# Patient Record
Sex: Male | Born: 1955 | Race: Black or African American | Hispanic: No | Marital: Married | Smoking: Never smoker
Health system: Southern US, Community
[De-identification: ages and names within clinical notes are randomized; demographics above are authoritative.]

## PROBLEM LIST (undated history)

## (undated) DIAGNOSIS — R51 Headache: Secondary | ICD-10-CM

## (undated) DIAGNOSIS — M778 Other enthesopathies, not elsewhere classified: Secondary | ICD-10-CM

## (undated) DIAGNOSIS — G4733 Obstructive sleep apnea (adult) (pediatric): Secondary | ICD-10-CM

## (undated) DIAGNOSIS — I10 Essential (primary) hypertension: Secondary | ICD-10-CM

## (undated) DIAGNOSIS — M7550 Bursitis of unspecified shoulder: Secondary | ICD-10-CM

## (undated) DIAGNOSIS — R519 Headache, unspecified: Secondary | ICD-10-CM

## (undated) DIAGNOSIS — M25519 Pain in unspecified shoulder: Secondary | ICD-10-CM

## (undated) DIAGNOSIS — E291 Testicular hypofunction: Secondary | ICD-10-CM

## (undated) DIAGNOSIS — G459 Transient cerebral ischemic attack, unspecified: Secondary | ICD-10-CM

## (undated) DIAGNOSIS — G4730 Sleep apnea, unspecified: Secondary | ICD-10-CM

## (undated) DIAGNOSIS — Z9989 Dependence on other enabling machines and devices: Secondary | ICD-10-CM

## (undated) DIAGNOSIS — M7580 Other shoulder lesions, unspecified shoulder: Secondary | ICD-10-CM

## (undated) DIAGNOSIS — M1990 Unspecified osteoarthritis, unspecified site: Secondary | ICD-10-CM

## (undated) HISTORY — PX: POLYPECTOMY: SHX149

## (undated) HISTORY — DX: Essential (primary) hypertension: I10

## (undated) HISTORY — PX: TONSILLECTOMY: SUR1361

## (undated) HISTORY — DX: Pain in unspecified shoulder: M25.519

## (undated) HISTORY — DX: Testicular hypofunction: E29.1

---
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
## 1976-05-31 HISTORY — PX: APPENDECTOMY: SHX54

## 2006-05-17 ENCOUNTER — Ambulatory Visit: Payer: Self-pay | Admitting: Internal Medicine

## 2006-06-17 ENCOUNTER — Ambulatory Visit: Payer: Self-pay | Admitting: Internal Medicine

## 2007-12-14 ENCOUNTER — Telehealth: Payer: Self-pay | Admitting: Internal Medicine

## 2008-01-02 ENCOUNTER — Telehealth: Payer: Self-pay | Admitting: Internal Medicine

## 2008-01-15 ENCOUNTER — Ambulatory Visit: Payer: Self-pay | Admitting: Internal Medicine

## 2008-01-15 DIAGNOSIS — I10 Essential (primary) hypertension: Secondary | ICD-10-CM

## 2008-01-15 DIAGNOSIS — M25519 Pain in unspecified shoulder: Secondary | ICD-10-CM

## 2008-01-15 HISTORY — DX: Pain in unspecified shoulder: M25.519

## 2008-01-15 HISTORY — DX: Essential (primary) hypertension: I10

## 2008-04-26 ENCOUNTER — Ambulatory Visit: Payer: Self-pay | Admitting: Internal Medicine

## 2008-04-26 LAB — CONVERTED CEMR LAB
ALT: 37 units/L (ref 0–53)
Albumin: 3.5 g/dL (ref 3.5–5.2)
BUN: 10 mg/dL (ref 6–23)
Basophils Relative: 0.3 % (ref 0.0–3.0)
Bilirubin Urine: NEGATIVE
Bilirubin, Direct: 0.1 mg/dL (ref 0.0–0.3)
CO2: 33 meq/L — ABNORMAL HIGH (ref 19–32)
Calcium: 8.9 mg/dL (ref 8.4–10.5)
Creatinine, Ser: 1.1 mg/dL (ref 0.4–1.5)
Direct LDL: 135.3 mg/dL
Glucose, Bld: 91 mg/dL (ref 70–99)
Glucose, Urine, Semiquant: NEGATIVE
HCT: 43.8 % (ref 39.0–52.0)
Hemoglobin: 15.3 g/dL (ref 13.0–17.0)
Lymphocytes Relative: 34.1 % (ref 12.0–46.0)
Monocytes Absolute: 0.6 10*3/uL (ref 0.1–1.0)
Monocytes Relative: 7.6 % (ref 3.0–12.0)
Neutro Abs: 4 10*3/uL (ref 1.4–7.7)
PSA: 0.41 ng/mL (ref 0.10–4.00)
Protein, U semiquant: NEGATIVE
RBC: 4.63 M/uL (ref 4.22–5.81)
Total CHOL/HDL Ratio: 5.4
Total Protein: 7 g/dL (ref 6.0–8.3)
Urobilinogen, UA: 0.2
VLDL: 13 mg/dL (ref 0–40)
WBC Urine, dipstick: NEGATIVE
pH: 6.5

## 2008-05-21 ENCOUNTER — Ambulatory Visit: Payer: Self-pay | Admitting: Internal Medicine

## 2008-05-21 DIAGNOSIS — E291 Testicular hypofunction: Secondary | ICD-10-CM | POA: Insufficient documentation

## 2008-05-21 HISTORY — DX: Testicular hypofunction: E29.1

## 2008-08-20 ENCOUNTER — Telehealth: Payer: Self-pay | Admitting: Internal Medicine

## 2008-11-28 HISTORY — PX: PLANTAR FASCIA SURGERY: SHX746

## 2008-12-20 ENCOUNTER — Encounter: Payer: Self-pay | Admitting: Internal Medicine

## 2008-12-26 ENCOUNTER — Ambulatory Visit (HOSPITAL_BASED_OUTPATIENT_CLINIC_OR_DEPARTMENT_OTHER): Admission: RE | Admit: 2008-12-26 | Discharge: 2008-12-26 | Payer: Self-pay | Admitting: Orthopedic Surgery

## 2009-12-17 ENCOUNTER — Ambulatory Visit: Payer: Self-pay | Admitting: Internal Medicine

## 2009-12-17 LAB — CONVERTED CEMR LAB
ALT: 33 units/L (ref 0–53)
AST: 33 units/L (ref 0–37)
Alkaline Phosphatase: 64 units/L (ref 39–117)
Basophils Relative: 0.4 % (ref 0.0–3.0)
Bilirubin Urine: NEGATIVE
Bilirubin, Direct: 0.2 mg/dL (ref 0.0–0.3)
Calcium: 8.8 mg/dL (ref 8.4–10.5)
Chloride: 106 meq/L (ref 96–112)
Cholesterol: 211 mg/dL — ABNORMAL HIGH (ref 0–200)
Creatinine, Ser: 1 mg/dL (ref 0.4–1.5)
Eosinophils Relative: 3 % (ref 0.0–5.0)
Lymphocytes Relative: 33.3 % (ref 12.0–46.0)
MCV: 95.7 fL (ref 78.0–100.0)
Neutrophils Relative %: 54.7 % (ref 43.0–77.0)
Nitrite: NEGATIVE
Protein, U semiquant: NEGATIVE
RBC: 4.36 M/uL (ref 4.22–5.81)
Total CHOL/HDL Ratio: 5
Total Protein: 6.6 g/dL (ref 6.0–8.3)
Urobilinogen, UA: 0.2
VLDL: 20 mg/dL (ref 0.0–40.0)
WBC: 9.4 10*3/uL (ref 4.5–10.5)

## 2010-01-01 ENCOUNTER — Ambulatory Visit: Payer: Self-pay | Admitting: Internal Medicine

## 2010-01-02 ENCOUNTER — Telehealth: Payer: Self-pay | Admitting: Internal Medicine

## 2010-01-05 ENCOUNTER — Encounter: Payer: Self-pay | Admitting: Internal Medicine

## 2010-02-05 ENCOUNTER — Encounter (INDEPENDENT_AMBULATORY_CARE_PROVIDER_SITE_OTHER): Payer: Self-pay | Admitting: *Deleted

## 2010-03-19 ENCOUNTER — Encounter (INDEPENDENT_AMBULATORY_CARE_PROVIDER_SITE_OTHER): Payer: Self-pay | Admitting: *Deleted

## 2010-06-30 NOTE — Letter (Signed)
Summary: Pre Visit No Show Letter  [facility]  [address] [place], [place] [address]   Phone: [phone]  Fax: [phone]        March 19, 2010 MRN: [id number]    Cesar [name] [address] [place], [place]  [address]    Dear [name],   We have been unable to reach you by phone concerning the pre-procedure visit that you missed on 03/19/10. For this reason,your procedure scheduled on 03/30/10 has been cancelled. Our scheduling staff will gladly assist you with rescheduling your appointments at a more convenient time. Please call our office at [phone] between the hours of 8:00am and 5:00pm, press option #2 to reach an appointment scheduler. Please consider updating your contact numbers at this time so that we can reach you by phone in the future with schedule changes or results.    Thank you,    Wyona Almas RN [facility]

## 2010-06-30 NOTE — Medication Information (Signed)
Summary: Androgel Gel Packet Approved  Androgel Gel Packet Approved   Imported By: Maryln Gottron 01/08/2010 11:18:15  _____________________________________________________________________  External Attachment:    Type:   Image     Comment:   External Document

## 2010-06-30 NOTE — Progress Notes (Signed)
Summary: androgel rx  Phone Note Outgoing Call   Call placed by: Duard Brady LPN,  January 02, 2010 2:30 PM Call placed to: [facility] Summary of Call: spoke with [facility] pharmacist - refill adjusted - disp #90 with 1 refill - controlled med - can only do 6mos . KIK Initial call taken by: Duard Brady LPN,  January 02, 2010 2:31 PM    Prescriptions: ANDROGEL 50 MG/5GM GEL (TESTOSTERONE) use every am  #90 x 1   Entered by:   Duard Brady LPN   Authorized by:   Gordy Savers  MD   Signed by:   Duard Brady LPN on 40/98/1191   Method used:   Historical   RxID:   [id number]

## 2010-06-30 NOTE — Letter (Signed)
Summary: Pre Visit Letter Revised  [facility]  [address] [place], [place] [address]   Phone: [phone]  Fax: [phone]        02/05/2010 MRN: [id number] Cesar [name] [address] [place], [place]  [address]             Procedure Date:  Oct 31 at 4pm   Welcome to the Gastroenterology Division at [facility].    You are scheduled to see a nurse for your pre-procedure visit on Mar 19, 2010 at 4:30pm on the 3rd floor at [facility], [address]  We ask that you try to arrive at our office 15 minutes prior to your appointment time to allow for check-in.  Please take a minute to review the attached form.  If you answer "Yes" to one or more of the questions on the first page, we ask that you call the person listed at your earliest opportunity.  If you answer "No" to all of the questions, please complete the rest of the form and bring it to your appointment.    Your nurse visit will consist of discussing your medical and surgical history, your immediate family medical history, and your medications.   If you are unable to list all of your medications on the form, please bring the medication bottles to your appointment and we will list them.  We will need to be aware of both prescribed and over the counter drugs.  We will need to know exact dosage information as well.    Please be prepared to read and sign documents such as consent forms, a financial agreement, and acknowledgement forms.  If necessary, and with your consent, a friend or relative is welcome to sit-in on the nurse visit with you.  Please bring your insurance card so that we may make a copy of it.  If your insurance requires a referral to see a specialist, please bring your referral form from your primary care physician.  No co-pay is required for this nurse visit.     If you cannot keep your appointment, please call [phone] to cancel or reschedule prior to your appointment date.  This  allows [place] the opportunity to schedule an appointment for another patient in need of care.    Thank you for choosing [facility] for your medical needs.  We appreciate the opportunity to care for you.  Please visit [place] at our website  to learn more about our practice.  Sincerely, The Gastroenterology Division

## 2010-06-30 NOTE — Assessment & Plan Note (Signed)
Summary: cpx//ccm   Vital Signs:  [name] profile:   55 year old male Height:      68.25 inches Weight:      215 pounds BMI:     32.57 Temp:     98.1 degrees F oral BP sitting:   100 / 64  (right arm) Cuff size:   regular  Vitals Entered By: Duard Brady LPN (January 01, 2010 1:53 PM) CC: cpx- doing well Is [name] Diabetic? No   CC:  cpx- doing well.  History of Present Illness: Cesar [name] who is seen today for a wellness exam.  He has a history of treated hypertension.  ED, and testosterone deficiency.  He has gained some weight of late and is in the process of exercise more regularly and has enjoyed a 6-pound weight loss.  No concerns or complaints  Preventive Screening-Counseling & Management  Alcohol-Tobacco     Smoking Status: never  Allergies (verified): No Known Drug Allergies  Past History:  Past Medical History: Reviewed history from 05/21/2008 and no changes required. Hypertension ED right shoulder pain testosterone deficiency  Past Surgical History: inguinal hernia repair colonoscopy -none left heel spur removal ( questionable fasciotomy) Dr. Eulah Pont  Family History: Reviewed history from 05/21/2008 and no changes required. father history of cervical vascular disease and repair.  Assess hypertension, dyslipidemia mother history of hypertension  One brother with testosterone deficiency  Social History: Reviewed history from 05/21/2008 and no changes required. Married Never Smoked Regular exercise-yes high school math teacher  Review of Systems       The [name] complains of weight gain.  The [name] denies anorexia, fever, weight loss, vision loss, decreased hearing, hoarseness, chest pain, syncope, dyspnea on exertion, peripheral edema, prolonged cough, headaches, hemoptysis, abdominal pain, melena, hematochezia, severe indigestion/heartburn, hematuria, incontinence, genital sores, muscle weakness, suspicious skin lesions,  transient blindness, difficulty walking, depression, unusual weight change, abnormal bleeding, enlarged lymph nodes, angioedema, breast masses, and testicular masses.    Physical Exam  General:  overweight-appearing.  110/70overweight-appearing.   Head:  Normocephalic and atraumatic without obvious abnormalities. No apparent alopecia or balding. Eyes:  No corneal or conjunctival inflammation noted. EOMI. Perrla. Funduscopic exam benign, without hemorrhages, exudates or papilledema. Vision grossly normal. Ears:  External ear exam shows no significant lesions or deformities.  Otoscopic examination reveals clear canals, tympanic membranes are intact bilaterally without bulging, retraction, inflammation or discharge. Hearing is grossly normal bilaterally. Nose:  External nasal examination shows no deformity or inflammation. Nasal mucosa are pink and moist without lesions or exudates. Mouth:  Oral mucosa and oropharynx without lesions or exudates.  Teeth in good repair. Neck:  No deformities, masses, or tenderness noted. Chest Wall:  No deformities, masses, tenderness or gynecomastia noted. Breasts:  No masses or gynecomastia noted Lungs:  Normal respiratory effort, chest expands symmetrically. Lungs are clear to auscultation, no crackles or wheezes. Heart:  Normal rate and regular rhythm. S1 and S2 normal without gallop, murmur, click, rub or other extra sounds. Abdomen:  Bowel sounds positive,abdomen soft and non-tender without masses, organomegaly or hernias noted. Rectal:  No external abnormalities noted. Normal sphincter tone. No rectal masses or tenderness. Genitalia:  Testes bilaterally descended without nodularity, tenderness or masses. No scrotal masses or lesions. No penis lesions or urethral discharge. Prostate:  Prostate gland firm and smooth, no enlargement, nodularity, tenderness, mass, asymmetry or induration. Msk:  No deformity or scoliosis noted of thoracic or lumbar spine.   Pulses:   R and L carotid,radial,femoral,dorsalis pedis and  posterior tibial pulses are full and equal bilaterally Neurologic:  alert & oriented X3, cranial nerves II-XII intact, strength normal in all extremities, gait normal, DTRs symmetrical and normal, and heel-to-shin normal.  alert & oriented X3, cranial nerves II-XII intact, strength normal in all extremities, gait normal, DTRs symmetrical and normal, and heel-to-shin normal.   Skin:  Intact without suspicious lesions or rashes Cervical Nodes:  No lymphadenopathy noted Axillary Nodes:  No palpable lymphadenopathy Inguinal Nodes:  No significant adenopathy Psych:  Cognition and judgment appear intact. Alert and cooperative with normal attention span and concentration. No apparent delusions, illusions, hallucinations   Impression & Recommendations:  Problem # 1:  Preventive Health Care (ICD-V70.0)  Orders: Gastroenterology Referral (GI)  Complete Medication List: 1)  Cialis 20 Mg Tabs (Tadalafil) .Marland Kitchen.. 1 as needed 2)  Benazepril-hydrochlorothiazide 20-12.5 Mg Tabs (Benazepril-hydrochlorothiazide) .... 2 once daily 3)  Naproxen Dr 500 Mg Tbec (Naproxen) .Marland Kitchen.. 1 two times a day 4)  Androgel 50 Mg/5gm Gel (Testosterone) .... Use every am  [name] Instructions: 1)  Limit your Sodium (Salt) to less than 2 grams a day(slightly less than 1/2 a teaspoon) to prevent fluid retention, swelling, or worsening of symptoms. 2)  It is important that you exercise regularly at least 20 minutes 5 times a week. If you develop chest pain, have severe difficulty breathing, or feel very tired , stop exercising immediately and seek medical attention. 3)  You need to lose weight. Consider a lower calorie diet and regular exercise.  4)  Check your Blood Pressure regularly. If it is above: 150/90 you should make an appointment. Prescriptions: ANDROGEL 50 MG/5GM GEL (TESTOSTERONE) use every am  #90 x 6   Entered and Authorized by:   Gordy Savers  MD   Signed by:    Gordy Savers  MD on 01/01/2010   Method used:   Print then Give to [name]   RxID:   [id number] BENAZEPRIL-HYDROCHLOROTHIAZIDE 20-12.5 MG  TABS (BENAZEPRIL-HYDROCHLOROTHIAZIDE) 2 once daily  #180 Tablet x 4   Entered and Authorized by:   Gordy Savers  MD   Signed by:   Gordy Savers  MD on 01/01/2010   Method used:   Electronically to        [facility]  [address] # [id number]* (retail)       [address]       [place], [place]  [address]       Ph: [phone] or [phone]       Fax: [phone]   RxID:   [id number] CIALIS 20 MG  TABS (TADALAFIL) 1 as needed  #6 x 4   Entered and Authorized by:   Gordy Savers  MD   Signed by:   Gordy Savers  MD on 01/01/2010   Method used:   Electronically to        [facility]  [address] # [id number]* (retail)       [address]       [place], [place]  [address]       Ph: [phone] or [phone]       Fax: [phone]   RxID:   [id number]

## 2010-07-14 ENCOUNTER — Ambulatory Visit (INDEPENDENT_AMBULATORY_CARE_PROVIDER_SITE_OTHER): Payer: BC Managed Care – PPO | Admitting: Internal Medicine

## 2010-07-14 ENCOUNTER — Encounter: Payer: Self-pay | Admitting: Internal Medicine

## 2010-07-14 DIAGNOSIS — E785 Hyperlipidemia, unspecified: Secondary | ICD-10-CM

## 2010-07-14 DIAGNOSIS — E291 Testicular hypofunction: Secondary | ICD-10-CM

## 2010-07-14 DIAGNOSIS — R42 Dizziness and giddiness: Secondary | ICD-10-CM

## 2010-07-14 DIAGNOSIS — I10 Essential (primary) hypertension: Secondary | ICD-10-CM

## 2010-07-14 MED ORDER — BENAZEPRIL HCL 20 MG PO TABS: 20.0000 mg | ORAL_TABLET | Freq: Every day | ORAL | Status: DC

## 2010-07-14 NOTE — Progress Notes (Signed)
  Subjective:    Patient ID: Cesar [name], male    DOB: 01-22-1956, 55 y.o.   MRN: [id number]  HPI  55 year old patient who has a history of hypertension, treated with combination therapy.  For the past 4 to 6 months.  He has had occasional episodes of significant lightheadedness.  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  He feels quite flushed ;  denies any diaphoresis, any focal neurological symptoms.  His father had a hemispheric stroke about his Age  that has resulted in a chronic right hemiparesis and aphasia.  He is concerned about carotid artery stenosis.  Laboratory studies from his last physical reviewed and did reveal mild dyslipidemia.     Review of Systems  Constitutional: Negative for fever, chills, appetite change and fatigue.  HENT: Negative for hearing loss, ear pain, congestion, sore throat, trouble swallowing, neck stiffness, dental problem, voice change and tinnitus.   Eyes: Negative for pain, discharge and visual disturbance.  Respiratory: Negative for cough, chest tightness, wheezing and stridor.   Cardiovascular: Negative for chest pain, palpitations and leg swelling.  Gastrointestinal: Negative for nausea, vomiting, abdominal pain, diarrhea, constipation, blood in stool and abdominal distention.  Genitourinary: Negative for urgency, hematuria, flank pain, discharge, difficulty urinating and genital sores.  Musculoskeletal: Negative for myalgias, back pain, joint swelling, arthralgias and gait problem.  Skin: Negative for rash.  Neurological: Positive for light-headedness. Negative for dizziness, syncope, speech difficulty, weakness, numbness and headaches.  Hematological: Negative for adenopathy. Does not bruise/bleed easily.  Psychiatric/Behavioral: Negative for behavioral problems and dysphoric mood. The patient is not nervous/anxious.        Objective:   Physical Exam  Constitutional: He is oriented to person, place, and  time. He appears well-developed and well-nourished.       Blood pressure 124/76 without orthostatic change.  No  symptoms were produced from standing from a supine position  HENT:  Head: Normocephalic and atraumatic.  Right Ear: External ear normal.  Left Ear: External ear normal.  Eyes: Conjunctivae and EOM are normal.  Neck: Normal range of motion.  Cardiovascular: Normal rate and normal heart sounds.   Pulmonary/Chest: Breath sounds normal.  Abdominal: Bowel sounds are normal.  Musculoskeletal: Normal range of motion. He exhibits no edema and no tenderness.  Neurological: He is alert and oriented to person, place, and time.  Psychiatric: He has a normal mood and affect. His behavior is normal.          Assessment & Plan:  Dizziness- concern about the possibility of hypotension;  Will change blood pressure medication to 20 mg of benazapril only; will schedule carotid artery.  Doppler study Hypertension Mild dyslipidemia-  Weiss modification discussed at length.  Will reassess lipid profile at the time of his next annual exam and consider statin therapy.  At that time

## 2010-07-14 NOTE — Patient Instructions (Signed)
Please check your blood pressure on a regular basis.  If it is consistently greater than 150/90, please make an office appointment.   It is important that you exercise regularly, at least 20 minutes 3 to 4 times per week.  If you develop chest pain or shortness of breath seek  medical attention.  Annual exam, in July/August

## 2010-07-15 ENCOUNTER — Encounter: Payer: Self-pay | Admitting: Internal Medicine

## 2010-07-15 DIAGNOSIS — R42 Dizziness and giddiness: Secondary | ICD-10-CM | POA: Insufficient documentation

## 2010-07-16 ENCOUNTER — Encounter (INDEPENDENT_AMBULATORY_CARE_PROVIDER_SITE_OTHER): Payer: BC Managed Care – PPO

## 2010-07-16 DIAGNOSIS — R42 Dizziness and giddiness: Secondary | ICD-10-CM

## 2010-07-20 ENCOUNTER — Telehealth: Payer: Self-pay | Admitting: Internal Medicine

## 2010-07-20 DIAGNOSIS — I10 Essential (primary) hypertension: Secondary | ICD-10-CM

## 2010-07-20 MED ORDER — BENAZEPRIL HCL 20 MG PO TABS: 20.0000 mg | ORAL_TABLET | Freq: Every day | ORAL | Status: DC

## 2010-07-20 NOTE — Telephone Encounter (Signed)
Pt was given a new rx for bp med, pt lost rx please call [facility] [address] [phone]

## 2010-07-20 NOTE — Telephone Encounter (Signed)
Called to [facility]. KIK

## 2010-07-20 NOTE — Telephone Encounter (Signed)
Message opened in error

## 2010-07-22 NOTE — Miscellaneous (Signed)
Summary: Orders Update  Clinical Lists Changes  Problems: Added new problem of DIZZINESS (ICD-780.4) Orders: Added new Test order of Carotid Duplex (Carotid Duplex) - Signed

## 2010-09-06 LAB — POCT I-STAT, CHEM 8
Hemoglobin: 15.6 g/dL (ref 13.0–17.0)
Potassium: 3.9 mEq/L (ref 3.5–5.1)
Sodium: 138 mEq/L (ref 135–145)
TCO2: 28 mmol/L (ref 0–100)

## 2010-09-06 LAB — POCT HEMOGLOBIN-HEMACUE: Hemoglobin: 15.5 g/dL (ref 13.0–17.0)

## 2010-10-13 NOTE — Op Note (Signed)
NAME:  Cesar [name], Cesar [name] NO.:  [id number]   MEDICAL RECORD NO.:  [id number]          PATIENT TYPE:  AMB   LOCATION:  DSC                          FACILITY:  [facility]   PHYSICIAN:  Loreta Ave, M.D. DATE OF BIRTH:  03/11/1956   DATE OF PROCEDURE:  12/26/2008  DATE OF DISCHARGE:                               OPERATIVE REPORT   PREOPERATIVE DIAGNOSIS:  Longstanding recalcitrant plantar fasciitis  left heel.   POSTOPERATIVE DIAGNOSIS:  Longstanding recalcitrant plantar fasciitis  left heel.   PROCEDURE:  Endoscopic plantar fascial release, left heel.   SURGEON:  Loreta Ave, MD   ASSISTANT:  Zonia Kief, PA   ANESTHESIA:  General.   BLOOD LOSS:  Minimal.   TOURNIQUET TIME:  30 minutes.   SPECIMEN:  None.   CULTURES:  None.   COMPLICATIONS:  None.   DRESSING:  Soft compressive with wooden shoe.   PROCEDURE:  The patient was brought to the operating room and placed on  the operating table in supine position.  After adequate anesthesia have  been obtained, tourniquet applied in the calf.  Prepped and draped in  usual sterile fashion.  Exsanguinated with elevation of Esmarch and  tourniquet inflated to 250 mmHg.  With fluoroscopic guidance the  attachment of the plantar fascia was identified to the os calcis.  A  small incision made medial and lateral.  Tissue cleared on the plantar  side of plantar fascia and a cannula for release was inserted.  Good  placement of this was confirmed fluoroscopically.  Utilizing the cannula  and the endoscopic system, I then divided the plantar fascia attachment  from medial to lateral in its entirety avoiding neurovascular  structures.  Once that was confirmed,  the wound was irrigated.  Cannula removed.  Incisions injected with  Marcaine and closed with nylon.  Sterile compressive dressing applied.  Tourniquet deflated and removed.  Anesthesia reversed.  Brought to the  recovery room.  Tolerated surgery well.  No  complications.      Loreta Ave, M.D.  Electronically Signed     DFM/MEDQ  D:  12/26/2008  T:  12/27/2008  Job:  [id number]

## 2010-11-04 ENCOUNTER — Other Ambulatory Visit: Payer: Self-pay | Admitting: Internal Medicine

## 2010-12-29 ENCOUNTER — Other Ambulatory Visit: Payer: Self-pay | Admitting: Internal Medicine

## 2010-12-29 ENCOUNTER — Ambulatory Visit (INDEPENDENT_AMBULATORY_CARE_PROVIDER_SITE_OTHER): Payer: BC Managed Care – PPO | Admitting: Internal Medicine

## 2010-12-29 ENCOUNTER — Encounter: Payer: Self-pay | Admitting: Internal Medicine

## 2010-12-29 DIAGNOSIS — E291 Testicular hypofunction: Secondary | ICD-10-CM

## 2010-12-29 DIAGNOSIS — G4730 Sleep apnea, unspecified: Secondary | ICD-10-CM | POA: Insufficient documentation

## 2010-12-29 DIAGNOSIS — I10 Essential (primary) hypertension: Secondary | ICD-10-CM

## 2010-12-29 MED ORDER — LISINOPRIL-HYDROCHLOROTHIAZIDE 20-12.5 MG PO TABS: 1.0000 | ORAL_TABLET | Freq: Every day | ORAL | Status: DC

## 2010-12-29 MED ORDER — VARDENAFIL HCL 20 MG PO TABS: 20.0000 mg | ORAL_TABLET | ORAL | Status: DC | PRN

## 2010-12-29 MED ORDER — TESTOSTERONE 50 MG/5GM (1%) TD GEL: 10.0000 g | Freq: Every day | TRANSDERMAL | Status: DC

## 2010-12-29 MED ORDER — NAPROXEN 500 MG PO TBEC: 500.0000 mg | DELAYED_RELEASE_TABLET | Freq: Two times a day (BID) | ORAL | Status: DC

## 2010-12-29 NOTE — Telephone Encounter (Signed)
Faxed androgel to [facility]

## 2010-12-29 NOTE — Telephone Encounter (Signed)
Pt is requesting refill on Androgel 50Mg  and Naproxen 500Mg 

## 2010-12-29 NOTE — Progress Notes (Signed)
  Subjective:    Patient ID: Cesar [name], male    DOB: Nov 16, 1955, 55 y.o.   MRN: [id number]  HPI  55 year old patient who is seen today for followup of his hypertension. He had blood pressure readings that were trending up and went back to combination ACE inhibition and diuretic therapy. Blood pressure has been well-controlled His wife complains of his loud snoring. He does have some daytime sleepiness issues He has a history of testosterone deficiency and ED. He states that the Cialis has not been very effective and wishes to switch to Levitra which has been more effective for him in the past    Review of Systems  Constitutional: Negative for fever, chills, appetite change and fatigue.  HENT: Negative for hearing loss, ear pain, congestion, sore throat, trouble swallowing, neck stiffness, dental problem, voice change and tinnitus.   Eyes: Negative for pain, discharge and visual disturbance.  Respiratory: Negative for cough, chest tightness, wheezing and stridor.   Cardiovascular: Negative for chest pain, palpitations and leg swelling.  Gastrointestinal: Negative for nausea, vomiting, abdominal pain, diarrhea, constipation, blood in stool and abdominal distention.  Genitourinary: Negative for urgency, hematuria, flank pain, discharge, difficulty urinating and genital sores.  Musculoskeletal: Negative for myalgias, back pain, joint swelling, arthralgias and gait problem.  Skin: Negative for rash.  Neurological: Negative for dizziness, syncope, speech difficulty, weakness, numbness and headaches.  Hematological: Negative for adenopathy. Does not bruise/bleed easily.  Psychiatric/Behavioral: Negative for behavioral problems and dysphoric mood. The patient is not nervous/anxious.        Objective:   Physical Exam  Constitutional: He is oriented to person, place, and time. He appears well-developed.       Overweight. Blood pressure 120/80  HENT:  Head: Normocephalic.  Right Ear: External  ear normal.  Left Ear: External ear normal.  Eyes: Conjunctivae and EOM are normal.       Very low hanging soft palate with pharyngeal crowding  Neck: Normal range of motion.  Cardiovascular: Normal rate and normal heart sounds.   Pulmonary/Chest: Breath sounds normal.  Abdominal: Bowel sounds are normal.  Musculoskeletal: Normal range of motion. He exhibits no edema and no tenderness.  Neurological: He is alert and oriented to person, place, and time.  Psychiatric: He has a normal mood and affect. His behavior is normal.          Assessment & Plan:   No problem-specific assessment & plan notes found for this encounter.  Hypertension well controlled Rule out sleep apnea.Sleep  Study will be scheduled  ED. We'll refill his AndroGel and switch to Levitra. Samples were provided

## 2010-12-29 NOTE — Patient Instructions (Signed)
Sleep study as discussed  Limit your sodium (Salt) intake    It is important that you exercise regularly, at least 20 minutes 3 to 4 times per week.  If you develop chest pain or shortness of breath seek  medical attention.  You need to lose weight.  Consider a lower calorie diet and regular exercise.  Return in 6 months for follow-up

## 2011-01-15 ENCOUNTER — Other Ambulatory Visit: Payer: Self-pay | Admitting: Internal Medicine

## 2011-01-15 DIAGNOSIS — G4730 Sleep apnea, unspecified: Secondary | ICD-10-CM

## 2011-02-14 ENCOUNTER — Ambulatory Visit (HOSPITAL_BASED_OUTPATIENT_CLINIC_OR_DEPARTMENT_OTHER): Payer: BC Managed Care – PPO | Attending: Internal Medicine

## 2011-02-14 DIAGNOSIS — G4733 Obstructive sleep apnea (adult) (pediatric): Secondary | ICD-10-CM | POA: Insufficient documentation

## 2011-02-14 DIAGNOSIS — G4730 Sleep apnea, unspecified: Secondary | ICD-10-CM

## 2011-02-25 DIAGNOSIS — G4733 Obstructive sleep apnea (adult) (pediatric): Secondary | ICD-10-CM

## 2011-02-25 NOTE — Procedures (Signed)
NAME:  Cesar, [name] NO.:  [id number]  MEDICAL RECORD NO.:  [id number]          PATIENT TYPE:  OUT  LOCATION:  SLEEP CENTER                 FACILITY[facility]  [facility]  PHYSICIAN:  Barbaraann Share, MD,FCCPDATE OF BIRTH:  August 04, 1955  DATE OF STUDY:  02/14/2011                           NOCTURNAL POLYSOMNOGRAM  REFERRING PHYSICIAN:  Gordy Savers, MD  INDICATION FOR STUDY:  Hypersomnia with sleep apnea.  EPWORTH SLEEPINESS SCORE:  24.  MEDICATIONS:  SLEEP ARCHITECTURE:  The patient had a total sleep time of 334 minutes with no slow wave sleep and decreased quantity of REM.  Sleep onset latency was normal at 30 minutes, and REM onset was adequate at 124 minutes.  Sleep efficiency was 61% during the diagnostic portion of the study and 86% during the titration portion of the study.  RESPIRATORY DATA:  The patient underwent a split-night protocol where he was found to have 157 obstructive events in the first 120 minutes of sleep.  This gave him an apnea/hypopnea index of 79 events per hour during the diagnostic portion of the study.  Events occurred all in the supine position, and there was moderate snoring noted throughout.  By protocol, he was then fitted with a large ResMed Quattro FX full-face mask, and CPAP titration was initiated.  At an optimal pressure of 12 cm of water, he had adequate control of his obstructive events.  OXYGEN DATA:  There was O2 desaturation as low as 88% with the patient's obstructive events.  CARDIAC DATA:  No clinically significant arrhythmias were noted.  MOVEMENT-PARASOMNIA:  The patient had no significant leg jerks or other abnormal behavior seen.  IMPRESSIONS-RECOMMENDATIONS:  Severe obstructive sleep apnea/hypopnea syndrome with an apnea/hypopnea index of 79 events per hour during the diagnostic portion of the study.  The patient was then fitted with a large Quattro FX full-face mask, and titrated to an optimal pressure of 12  cm of water.  He had good control of his obstructive events at this level.  He should also be encouraged to work aggressively on weight loss.     Barbaraann Share, MD,FCCP Diplomate, American Board of Sleep Medicine Electronically Signed    [facility]/MEDQ  D:  02/25/2011 08:19:40  T:  02/25/2011 08:56:11  Job:  [id number]

## 2011-03-05 NOTE — Progress Notes (Signed)
Quick Note:  Spoke with pt - he will need rx for equipment. ssked him to call ins co. And find out who they require him use , call back with that info and we will fax rx. kIK ______

## 2011-03-25 ENCOUNTER — Telehealth: Payer: Self-pay | Admitting: Internal Medicine

## 2011-03-25 NOTE — Telephone Encounter (Signed)
Pt is requesting a cpap machine and his sleep study results. Thanks.

## 2011-03-26 ENCOUNTER — Telehealth: Payer: Self-pay

## 2011-03-26 NOTE — Telephone Encounter (Signed)
Order, demographics , last office note and sleep study faxed to [facility]. KIK

## 2011-03-26 NOTE — Telephone Encounter (Signed)
Pt would like to use [facility] for cpap machine fax order to [phone].

## 2011-03-26 NOTE — Telephone Encounter (Signed)
Opened in error

## 2011-03-26 NOTE — Telephone Encounter (Signed)
Please advise - ok to send rx for cpap equipment

## 2011-03-26 NOTE — Telephone Encounter (Signed)
ok 

## 2011-05-27 ENCOUNTER — Telehealth: Payer: Self-pay

## 2011-05-27 NOTE — Telephone Encounter (Signed)
Per Tim Lair, OK to use [facility], same day for this pt tomorrow.  I informed pt on 11am appt time, Nelva Bush will schedule

## 2011-05-27 NOTE — Telephone Encounter (Signed)
Pt sates he has been having chest pain x 2 weeks.  Pt states he thought it was gas or a pulled muscle.  Pt states it is not sharpe pain. Pt feels it when he raises his arm across his chest. Pt denies pain on left side or facial drooping. Pt would like an appt.

## 2011-05-27 NOTE — Telephone Encounter (Signed)
Pt called back to check on status of getting an ov tomorrow re: chest pains that are non-heart related. Pls call back asap.

## 2011-05-28 ENCOUNTER — Encounter: Payer: Self-pay | Admitting: Internal Medicine

## 2011-05-28 ENCOUNTER — Ambulatory Visit (INDEPENDENT_AMBULATORY_CARE_PROVIDER_SITE_OTHER)
Admission: RE | Admit: 2011-05-28 | Discharge: 2011-05-28 | Disposition: A | Discharge status: Home / Self Care | Payer: BC Managed Care – PPO | Source: Ambulatory Visit | Attending: Internal Medicine | Admitting: Internal Medicine

## 2011-05-28 ENCOUNTER — Ambulatory Visit (INDEPENDENT_AMBULATORY_CARE_PROVIDER_SITE_OTHER): Payer: BC Managed Care – PPO | Admitting: Internal Medicine

## 2011-05-28 DIAGNOSIS — M25519 Pain in unspecified shoulder: Secondary | ICD-10-CM

## 2011-05-28 DIAGNOSIS — I10 Essential (primary) hypertension: Secondary | ICD-10-CM

## 2011-05-28 DIAGNOSIS — R079 Chest pain, unspecified: Secondary | ICD-10-CM

## 2011-05-28 NOTE — Progress Notes (Signed)
  Subjective:    Patient ID: Cesar [name], male    DOB: 1955/09/16, 55 y.o.   MRN: [id number]  HPI  55 year old patient who presents with a two-week history of chest pain and left shoulder pain. Chest pain is often aggravated by movement of the left shoulder he also describes paroxysms of fleeting sharp pain unrelated to activity. Denies any reflux symptoms or associated nausea or shortness of breath. He denies any exertional chest pain. He is accompanied  by his wife who  is quite concerned about a cardiac condition. His father and grandfather had vascular disease. Father has been disabled for a number of years due to the massive left brain stroke    Review of Systems  Constitutional: Negative for fever, chills, appetite change and fatigue.  HENT: Negative for hearing loss, ear pain, congestion, sore throat, trouble swallowing, neck stiffness, dental problem, voice change and tinnitus.   Eyes: Negative for pain, discharge and visual disturbance.  Respiratory: Negative for cough, chest tightness, wheezing and stridor.   Cardiovascular: Positive for chest pain. Negative for palpitations and leg swelling.  Gastrointestinal: Negative for nausea, vomiting, abdominal pain, diarrhea, constipation, blood in stool and abdominal distention.  Genitourinary: Negative for urgency, hematuria, flank pain, discharge, difficulty urinating and genital sores.  Musculoskeletal: Negative for myalgias, back pain, joint swelling, arthralgias (the shoulder discomfort with movement) and gait problem.  Skin: Negative for rash.  Neurological: Negative for dizziness, syncope, speech difficulty, weakness, numbness and headaches.  Hematological: Negative for adenopathy. Does not bruise/bleed easily.  Psychiatric/Behavioral: Negative for behavioral problems and dysphoric mood. The patient is not nervous/anxious.        Objective:   Physical Exam  Constitutional: He is oriented to person, place, and time. He appears  well-developed.       Blood pressure 110/70  HENT:  Head: Normocephalic.  Right Ear: External ear normal.  Left Ear: External ear normal.  Eyes: Conjunctivae and EOM are normal.  Neck: Normal range of motion.  Cardiovascular: Normal rate, regular rhythm, normal heart sounds and intact distal pulses.  Exam reveals no gallop and no friction rub.   No murmur heard. Pulmonary/Chest: Effort normal and breath sounds normal. No respiratory distress. He has no wheezes. He has no rales.  Abdominal: Bowel sounds are normal.  Musculoskeletal: Normal range of motion. He exhibits no edema and no tenderness.  Neurological: He is alert and oriented to person, place, and time.  Psychiatric: He has a normal mood and affect. His behavior is normal.          Assessment & Plan:   Noncardiac chest pain. We'll obtain a twelve-lead EKG to further reassure the patient and his wife. They also request a chest x-ray Hypertension well controlled  We'll schedule for a complete physical

## 2011-05-28 NOTE — Patient Instructions (Signed)
Limit your sodium (Salt) intake    It is important that you exercise regularly, at least 20 minutes 3 to 4 times per week.  If you develop chest pain or shortness of breath seek  medical attention.  Call or return to clinic prn if these symptoms worsen or fail to improve as anticipated.  

## 2011-05-31 ENCOUNTER — Encounter: Payer: Self-pay | Admitting: Internal Medicine

## 2011-05-31 NOTE — Progress Notes (Signed)
Quick Note:  Attempted to call- hm # disconnected , cell# will not let me leave a msg to call us - I will send a letter ______

## 2011-06-29 ENCOUNTER — Ambulatory Visit: Payer: BC Managed Care – PPO | Admitting: Internal Medicine

## 2011-07-30 ENCOUNTER — Other Ambulatory Visit: Payer: Self-pay | Admitting: Internal Medicine

## 2011-08-24 ENCOUNTER — Other Ambulatory Visit: Payer: BC Managed Care – PPO

## 2011-08-30 ENCOUNTER — Encounter: Payer: BC Managed Care – PPO | Admitting: Internal Medicine

## 2011-08-31 ENCOUNTER — Encounter: Payer: BC Managed Care – PPO | Admitting: Internal Medicine

## 2011-08-31 ENCOUNTER — Telehealth: Payer: Self-pay

## 2011-08-31 NOTE — Telephone Encounter (Signed)
Missed cpx appt - father is in hospital and they are waiting for him to pass - will r/s cpx

## 2011-10-19 ENCOUNTER — Other Ambulatory Visit (INDEPENDENT_AMBULATORY_CARE_PROVIDER_SITE_OTHER): Payer: BC Managed Care – PPO

## 2011-10-19 DIAGNOSIS — Z0000 Encounter for general adult medical examination without abnormal findings: Secondary | ICD-10-CM

## 2011-10-19 LAB — HEPATIC FUNCTION PANEL
AST: 41 U/L — ABNORMAL HIGH (ref 0–37)
Albumin: 3.8 g/dL (ref 3.5–5.2)
Alkaline Phosphatase: 60 U/L (ref 39–117)
Total Protein: 6.8 g/dL (ref 6.0–8.3)

## 2011-10-19 LAB — CBC WITH DIFFERENTIAL/PLATELET
Basophils Absolute: 0 10*3/uL (ref 0.0–0.1)
Basophils Relative: 0.6 % (ref 0.0–3.0)
Eosinophils Relative: 5.8 % — ABNORMAL HIGH (ref 0.0–5.0)
Hemoglobin: 15.9 g/dL (ref 13.0–17.0)
Lymphocytes Relative: 33.6 % (ref 12.0–46.0)
Monocytes Relative: 8.5 % (ref 3.0–12.0)
Neutro Abs: 4.5 10*3/uL (ref 1.4–7.7)
RBC: 5.02 Mil/uL (ref 4.22–5.81)
WBC: 8.8 10*3/uL (ref 4.5–10.5)

## 2011-10-19 LAB — LIPID PANEL
Cholesterol: 185 mg/dL (ref 0–200)
LDL Cholesterol: 121 mg/dL — ABNORMAL HIGH (ref 0–99)
VLDL: 26.8 mg/dL (ref 0.0–40.0)

## 2011-10-19 LAB — BASIC METABOLIC PANEL
Calcium: 8.8 mg/dL (ref 8.4–10.5)
GFR: 105.13 mL/min (ref >60.00)
Sodium: 141 mEq/L (ref 135–145)

## 2011-10-19 LAB — POCT URINALYSIS DIPSTICK
Ketones, UA: NEGATIVE
Protein, UA: NEGATIVE
Spec Grav, UA: 1.025

## 2011-10-26 ENCOUNTER — Ambulatory Visit (INDEPENDENT_AMBULATORY_CARE_PROVIDER_SITE_OTHER): Payer: BC Managed Care – PPO | Admitting: Internal Medicine

## 2011-10-26 ENCOUNTER — Encounter: Payer: Self-pay | Admitting: Internal Medicine

## 2011-10-26 VITALS — BP 118/80 | HR 68 | Temp 98.2°F | Resp 20 | Ht 68.0 in | Wt 210.0 lb

## 2011-10-26 DIAGNOSIS — I10 Essential (primary) hypertension: Secondary | ICD-10-CM

## 2011-10-26 DIAGNOSIS — Z0000 Encounter for general adult medical examination without abnormal findings: Secondary | ICD-10-CM

## 2011-10-26 NOTE — Progress Notes (Signed)
  Subjective:    Patient ID: Cesar [name], male    DOB: 01/13/1956, 56 y.o.   MRN: [id number]  HPI  Wt Readings from Last 3 Encounters:  10/26/11 210 lb (95.255 kg)  05/28/11 218 lb (98.884 kg)  12/29/10 219 lb (99.338 kg)    Review of Systems     Objective:   Physical Exam        Assessment & Plan:

## 2011-10-26 NOTE — Patient Instructions (Signed)
It is important that you exercise regularly, at least 20 minutes 3 to 4 times per week.  If you develop chest pain or shortness of breath seek  medical attention.  Limit your sodium (Salt) intake   Please check your blood pressure on a regular basis.  If it is consistently greater than 150/90, please make an office appointment.   You need to lose weight.  Consider a lower calorie diet and regular exercise.

## 2011-10-26 NOTE — Progress Notes (Signed)
Subjective:    Patient ID: Cesar [name], male    DOB: May 04, 1956, 56 y.o.   MRN: [id number]  HPI  56 year old patient who is seen today for a health maintenance examination. Medical problems include treated hypertension. He also has a history of testosterone deficiency and OS A. He has done quite well.  His father has a long history of cerebral vascular disease and has died within the past 6 weeks family history is positive for hypertension  No screening colonoscopy  Past Medical History  Diagnosis Date  . HYPERTENSION 01/15/2008  . SHOULDER, PAIN 01/15/2008  . TESTOSTERONE DEFICIENCY 05/21/2008    History   Social History  . Marital Status: Married    Spouse Name: N/A    Number of Children: N/A  . Years of Education: N/A   Occupational History  . Not on file.   Social History Main Topics  . Smoking status: Never Smoker   . Smokeless tobacco: Never Used  . Alcohol Use: No  . Drug Use: No  . Sexually Active: Not on file   Other Topics Concern  . Not on file   Social History Narrative  . No narrative on file    No past surgical history on file.  No family history on file.  No Known Allergies  Current Outpatient Prescriptions on File Prior to Visit  Medication Sig Dispense Refill  . ANDROGEL 50 MG/5GM GEL APPLY 2 PACKETS (10 GRAMS) TO THE SKIN ONCE DAILY.  180 Package  3  . lisinopril-hydrochlorothiazide (PRINZIDE,ZESTORETIC) 20-12.5 MG per tablet Take 1 tablet by mouth daily.  90 tablet  6  . naproxen (EC NAPROSYN) 500 MG EC tablet Take 1 tablet (500 mg total) by mouth 2 (two) times daily with a meal.  180 tablet  1  . vardenafil (LEVITRA) 20 MG tablet Take 1 tablet (20 mg total) by mouth as needed for erectile dysfunction.  20 tablet  1    BP 118/80  Pulse 68  Temp(Src) 98.2 F (36.8 C) (Oral)  Resp 20  Ht 5\' 8"  (1.727 m)  Wt 210 lb (95.255 kg)  BMI 31.93 kg/m2  SpO2 98%      Review of Systems  Constitutional: Negative for fever, chills, activity  change, appetite change and fatigue.  HENT: Negative for hearing loss, ear pain, congestion, rhinorrhea, sneezing, mouth sores, trouble swallowing, neck pain, neck stiffness, dental problem, voice change, sinus pressure and tinnitus.   Eyes: Negative for photophobia, pain, redness and visual disturbance.  Respiratory: Negative for apnea, cough, choking, chest tightness, shortness of breath and wheezing.   Cardiovascular: Negative for chest pain, palpitations and leg swelling.  Gastrointestinal: Negative for nausea, vomiting, abdominal pain, diarrhea, constipation, blood in stool, abdominal distention, anal bleeding and rectal pain.  Genitourinary: Negative for dysuria, urgency, frequency, hematuria, flank pain, decreased urine volume, discharge, penile swelling, scrotal swelling, difficulty urinating, genital sores and testicular pain.  Musculoskeletal: Negative for myalgias, back pain, joint swelling, arthralgias and gait problem.       Left shoulder pain  Skin: Negative for color change, rash and wound.  Neurological: Negative for dizziness, tremors, seizures, syncope, facial asymmetry, speech difficulty, weakness, light-headedness, numbness and headaches.  Hematological: Negative for adenopathy. Does not bruise/bleed easily.  Psychiatric/Behavioral: Negative for suicidal ideas, hallucinations, behavioral problems, confusion, sleep disturbance, self-injury, dysphoric mood, decreased concentration and agitation. The patient is not nervous/anxious.        Objective:   Physical Exam  Constitutional: He appears well-developed and well-nourished.  HENT:  Head: Normocephalic and atraumatic.  Right Ear: External ear normal.  Left Ear: External ear normal.  Nose: Nose normal.  Mouth/Throat: Oropharynx is clear and moist.  Eyes: Conjunctivae and EOM are normal. Pupils are equal, round, and reactive to light. No scleral icterus.  Neck: Normal range of motion. Neck supple. No JVD present. No  thyromegaly present.  Cardiovascular: Normal rate, regular rhythm, normal heart sounds and intact distal pulses.  Exam reveals no gallop and no friction rub.   No murmur heard.      Left dorsalis pedis pulse diminished  Pulmonary/Chest: Effort normal and breath sounds normal. He exhibits no tenderness.  Abdominal: Soft. Bowel sounds are normal. He exhibits no distension and no mass. There is no tenderness.  Genitourinary: Prostate normal and penis normal.  Musculoskeletal: Normal range of motion. He exhibits no edema and no tenderness.  Lymphadenopathy:    He has no cervical adenopathy.  Neurological: He is alert. He has normal reflexes. No cranial nerve deficit. Coordination normal.  Skin: Skin is warm and dry. No rash noted.       Small callus versus plantar wart involving the arch of the left foot  Psychiatric: He has a normal mood and affect. His behavior is normal.          Assessment & Plan:   Preventive health examination Hypertension well controlled Testosterone deficiency ED OSA  continue home CPAP  Medical regimen unchanged. There's been some modest weight loss. Additional weight loss exercise regimen encouraged return in one year or as needed

## 2012-01-23 ENCOUNTER — Other Ambulatory Visit: Payer: Self-pay | Admitting: Internal Medicine

## 2012-04-13 ENCOUNTER — Other Ambulatory Visit: Payer: Self-pay | Admitting: Internal Medicine

## 2012-04-14 NOTE — Telephone Encounter (Signed)
Med filled.  

## 2012-05-31 HISTORY — PX: COLONOSCOPY: SHX174

## 2012-06-09 ENCOUNTER — Other Ambulatory Visit: Payer: Self-pay | Admitting: Internal Medicine

## 2012-07-06 ENCOUNTER — Other Ambulatory Visit: Payer: Self-pay | Admitting: Internal Medicine

## 2012-07-06 ENCOUNTER — Other Ambulatory Visit: Payer: Self-pay | Admitting: *Deleted

## 2012-07-06 NOTE — Telephone Encounter (Signed)
Rx faxed to pharmacy for Androgel.

## 2012-11-11 ENCOUNTER — Other Ambulatory Visit: Payer: Self-pay | Admitting: Internal Medicine

## 2012-11-13 ENCOUNTER — Other Ambulatory Visit: Payer: Self-pay | Admitting: *Deleted

## 2012-11-13 MED ORDER — TESTOSTERONE 50 MG/5GM (1%) TD GEL: TRANSDERMAL | Status: DC

## 2012-11-28 ENCOUNTER — Telehealth: Payer: Self-pay | Admitting: Internal Medicine

## 2012-11-28 ENCOUNTER — Ambulatory Visit (INDEPENDENT_AMBULATORY_CARE_PROVIDER_SITE_OTHER): Payer: BC Managed Care – PPO | Admitting: Internal Medicine

## 2012-11-28 ENCOUNTER — Encounter: Payer: Self-pay | Admitting: Internal Medicine

## 2012-11-28 VITALS — BP 150/90 | HR 82 | Temp 98.3°F | Resp 20 | Wt 220.0 lb

## 2012-11-28 DIAGNOSIS — Z1211 Encounter for screening for malignant neoplasm of colon: Secondary | ICD-10-CM

## 2012-11-28 DIAGNOSIS — S7010XA Contusion of unspecified thigh, initial encounter: Secondary | ICD-10-CM

## 2012-11-28 DIAGNOSIS — I10 Essential (primary) hypertension: Secondary | ICD-10-CM

## 2012-11-28 DIAGNOSIS — S7012XA Contusion of left thigh, initial encounter: Secondary | ICD-10-CM

## 2012-11-28 NOTE — Telephone Encounter (Signed)
Pt needs a referral for colonoscopy. Pt would like to have this done over the summer.

## 2012-11-28 NOTE — Patient Instructions (Addendum)
Orthopedic consultation as discussed  Limit your sodium (Salt) intake  Please check your blood pressure on a regular basis.  If it is consistently greater than 150/90, please make an office appointment.  Return in 6 months for follow-up

## 2012-11-28 NOTE — Telephone Encounter (Signed)
Pt notified referral for Colonoscopy sent and someone will be contacting him.

## 2012-11-28 NOTE — Progress Notes (Signed)
Subjective:    Patient ID: Cesar [name], male    DOB: [birth year], 57 y.o.   MRN: [id number]  HPI  57 year old patient who has treated hypertension. He is seen today with a chief complaint of persistent left thigh pain. 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 He required assistance to move from the basketball floor and required crutches for a couple of days. After the trauma he had considerable soft tissue swelling from the left mid thigh distally to involve the ankle and foot. He was seen at an urgent care on 2 occasions and x-rays were negative He continues to have some pain at times the leg gives away. There is been no falls. The left lower extremity edema has all resolved  Past Medical History  Diagnosis Date  . HYPERTENSION 01/15/2008  . SHOULDER, PAIN 01/15/2008  . TESTOSTERONE DEFICIENCY 05/21/2008    History   Social History  . Marital Status: Married    Spouse Name: N/A    Number of Children: N/A  . Years of Education: N/A   Occupational History  . Not on file.   Social History Main Topics  . Smoking status: Never Smoker   . Smokeless tobacco: Never Used  . Alcohol Use: No  . Drug Use: No  . Sexually Active: Not on file   Other Topics Concern  . Not on file   Social History Narrative  . No narrative on file    History reviewed. No pertinent past surgical history.  No family history on file.  No Known Allergies  Current Outpatient Prescriptions on File Prior to Visit  Medication Sig Dispense Refill  . lisinopril-hydrochlorothiazide (PRINZIDE,ZESTORETIC) 20-12.5 MG per tablet take 1 tablet by mouth once daily  30 tablet  6  . naproxen (EC NAPROSYN) 500 MG EC tablet Take 1 tablet (500 mg total) by mouth 2 (two) times daily with a meal.  180 tablet  1  . testosterone (ANDROGEL) 50 MG/5GM GEL apply 2 packets TO THE SKIN daily  300  g  1   No current facility-administered medications on file prior to visit.    BP 150/90  Pulse 82  Temp(Src) 98.3 F (36.8 C) (Oral)  Resp 20  Wt 220 lb (99.791 kg)  BMI 33.46 kg/m2  SpO2 97%       Review of Systems  Constitutional: Negative for fever, chills, appetite change and fatigue.  HENT: Negative for hearing loss, ear pain, congestion, sore throat, trouble swallowing, neck stiffness, dental problem, voice change and tinnitus.   Eyes: Negative for pain, discharge and visual disturbance.  Respiratory: Negative for cough, chest tightness, wheezing and stridor.   Cardiovascular: Negative for chest pain, palpitations and leg swelling.  Gastrointestinal: Negative for nausea, vomiting, abdominal pain, diarrhea, constipation, blood in stool and abdominal distention.  Genitourinary: Negative for urgency, hematuria, flank pain, discharge, difficulty urinating and genital sores.  Musculoskeletal: Positive for myalgias and gait problem. Negative for back pain, joint swelling and arthralgias.  Skin: Negative for rash.  Neurological: Negative for dizziness, syncope, speech difficulty, weakness, numbness and headaches.  Hematological: Negative for adenopathy. Does not bruise/bleed easily.  Psychiatric/Behavioral: Negative for behavioral problems and dysphoric mood. The patient is not nervous/anxious.        Objective:   Physical Exam  Constitutional: He appears well-developed and well-nourished. No distress.  Repeat blood pressure 118/76  Musculoskeletal:  Small  residual hematoma left anteriolateral distal thigh near the patella          Assessment & Plan:   Soft tissue injury left anterolateral distal thigh.  Patient is still having some difficulties after 3 months and is requesting orthopedic evaluation. Will schedule Hypertension stable  Annual preventive health exam in 6 months Orthopedic evaluation

## 2012-11-29 ENCOUNTER — Ambulatory Visit: Payer: BC Managed Care – PPO | Admitting: Internal Medicine

## 2012-12-11 ENCOUNTER — Encounter: Payer: Self-pay | Admitting: Internal Medicine

## 2012-12-19 ENCOUNTER — Encounter (HOSPITAL_BASED_OUTPATIENT_CLINIC_OR_DEPARTMENT_OTHER): Payer: Self-pay | Admitting: *Deleted

## 2012-12-19 NOTE — Progress Notes (Signed)
Pt was here 2010=did well-has sleep apnea-uses a cpap-to bring -to come in for bmet-ekg

## 2012-12-20 ENCOUNTER — Other Ambulatory Visit: Payer: Self-pay | Admitting: Orthopedic Surgery

## 2012-12-20 ENCOUNTER — Encounter (HOSPITAL_BASED_OUTPATIENT_CLINIC_OR_DEPARTMENT_OTHER)
Admission: RE | Admit: 2012-12-20 | Discharge: 2012-12-20 | Disposition: A | Discharge status: Home / Self Care | Payer: BC Managed Care – PPO | Source: Ambulatory Visit | Attending: Orthopedic Surgery | Admitting: Orthopedic Surgery

## 2012-12-20 LAB — BASIC METABOLIC PANEL
CO2: 28 mEq/L (ref 19–32)
GFR calc non Af Amer: 81 mL/min — ABNORMAL LOW (ref >90)
Glucose, Bld: 96 mg/dL (ref 70–99)
Potassium: 4.4 mEq/L (ref 3.5–5.1)
Sodium: 139 mEq/L (ref 135–145)

## 2012-12-20 NOTE — Progress Notes (Signed)
EKG reviewed by Dr. Jacklynn Bue ok for surgery

## 2012-12-20 NOTE — H&P (Signed)
[facility] [address]   [address] [place], [place] [address] [phone] A Division of [facility]  Loreta Ave, M.D.   Robert A. Thurston Hole, M.D.   Burnell Blanks, M.D.   Eulas Post, M.D.   Lunette Stands, M.D Buford Dresser, M.D.  Charlsie Quest, M.D.   Estell Harpin, M.D.   Melina Fiddler, M.D. Genene Churn. Barry Dienes, PA-C            Kirstin A. 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  Since that time he has had difficulty extending his leg.  Initially it was impossible to do so, now he can do so, but only weakly and with great effort.  He states that the pain in the left thigh is constant, mild to moderate, sharp with certain movements and aching with others.  There is some associated swelling, it is unchanged over the last several weeks.  It does not awaken him from sleep.  Activity makes it worse.  He has attempted to rehab his legs at the gym and while the right leg has good strength, the left knee has significant deficits whenever he is trying to isolate the quadriceps in particular.  Ice makes it feel somewhat better.  He has tried an over the counter brace with little, if any, good affect.  He has had no prior problems in the past.  He has no allergies to report.  He is not a diabetic.  He does not have high cholesterol.  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  He takes Naprosyn as needed.   Past surgical history: Endoscopic plantar fascial release in 2010, appendectomy, hernia and tonsillectomy as a child.  No adverse reactions to anesthesia.    Family history: Positive for hypertension and arthritis. Social history: Does not smoke or drink.  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  He is married and lives with five other people.    EXAMINATION: Height: 5?8.  Weight: 215 pounds.  Blood pressure: 130/91.  In no acute distress.  Examination of his leg shows no appreciable outward deformity.  Palpable scar or mass in the distal portion of the quadriceps and into the quad tendon.  There seems to be an area of thinning just at the distal most portion almost to the attachment of the patella.  He has notable difficulty coming to full extension actively, although passively he comes to that quite easily.  There is no obvious discontinuity of the quadriceps tendon fibers.  He also has a trace to 1+ effusion with positive McMurray's exam along the medial joint line.  Otherwise neurovascularly intact.    X-RAYS: Plain radiographs show no evidence of any sort of ossification consistent with myositis ossificans.  No other abnormalities noted.  IMPRESSION: Left thigh and knee pain.    PLAN: Patient will undergo an MRI scan of the knee to include the quad tendon to determine if there  is indeed any further damage to the knee itself or particularly to the quadriceps tendon before beginning physical therapy in hopes of regaining his strength back from the obviously atrophied muscle.  He will return to see Dr. Eulah Pont after this is accomplished to discuss his results.   Loreta Ave, M.D.   Electronically verified by Loreta Ave, M.D.

## 2012-12-21 ENCOUNTER — Encounter (HOSPITAL_BASED_OUTPATIENT_CLINIC_OR_DEPARTMENT_OTHER): Payer: Self-pay | Admitting: Anesthesiology

## 2012-12-21 ENCOUNTER — Encounter (HOSPITAL_BASED_OUTPATIENT_CLINIC_OR_DEPARTMENT_OTHER): Payer: Self-pay | Admitting: *Deleted

## 2012-12-21 ENCOUNTER — Encounter (HOSPITAL_BASED_OUTPATIENT_CLINIC_OR_DEPARTMENT_OTHER)
Admission: RE | Disposition: A | Discharge status: Home / Self Care | Payer: Self-pay | Source: Ambulatory Visit | Attending: Orthopedic Surgery

## 2012-12-21 ENCOUNTER — Ambulatory Visit (HOSPITAL_BASED_OUTPATIENT_CLINIC_OR_DEPARTMENT_OTHER): Payer: BC Managed Care – PPO | Admitting: Anesthesiology

## 2012-12-21 ENCOUNTER — Ambulatory Visit (HOSPITAL_BASED_OUTPATIENT_CLINIC_OR_DEPARTMENT_OTHER)
Admission: RE | Admit: 2012-12-21 | Discharge: 2012-12-21 | Disposition: A | Discharge status: Home / Self Care | Payer: BC Managed Care – PPO | Source: Ambulatory Visit | Attending: Orthopedic Surgery | Admitting: Orthopedic Surgery

## 2012-12-21 DIAGNOSIS — M24469 Recurrent dislocation, unspecified knee: Secondary | ICD-10-CM | POA: Insufficient documentation

## 2012-12-21 DIAGNOSIS — IMO0002 Reserved for concepts with insufficient information to code with codable children: Secondary | ICD-10-CM | POA: Insufficient documentation

## 2012-12-21 DIAGNOSIS — M66259 Spontaneous rupture of extensor tendons, unspecified thigh: Secondary | ICD-10-CM | POA: Insufficient documentation

## 2012-12-21 DIAGNOSIS — S83289A Other tear of lateral meniscus, current injury, unspecified knee, initial encounter: Secondary | ICD-10-CM | POA: Insufficient documentation

## 2012-12-21 DIAGNOSIS — Y9229 Other specified public building as the place of occurrence of the external cause: Secondary | ICD-10-CM | POA: Insufficient documentation

## 2012-12-21 DIAGNOSIS — W219XXA Striking against or struck by unspecified sports equipment, initial encounter: Secondary | ICD-10-CM | POA: Insufficient documentation

## 2012-12-21 HISTORY — DX: Sleep apnea, unspecified: G47.30

## 2012-12-21 HISTORY — PX: KNEE ARTHROSCOPY WITH PATELLAR TENDON REPAIR: SHX5656

## 2012-12-21 LAB — POCT HEMOGLOBIN-HEMACUE: Hemoglobin: 15.8 g/dL (ref 13.0–17.0)

## 2012-12-21 SURGERY — KNEE ARTHROSCOPY WITH PATELLAR TENDON REPAIR
Anesthesia: General | Site: Knee | Laterality: Left | Wound class: Clean

## 2012-12-21 MED ORDER — DEXAMETHASONE SODIUM PHOSPHATE 4 MG/ML IJ SOLN
INTRAMUSCULAR | Status: DC | PRN
  Administered 2012-12-21: 10 mg via INTRAVENOUS

## 2012-12-21 MED ORDER — ONDANSETRON HCL 4 MG/2ML IJ SOLN
INTRAMUSCULAR | Status: DC | PRN
  Administered 2012-12-21: 4 mg via INTRAVENOUS

## 2012-12-21 MED ORDER — BUPIVACAINE HCL (PF) 0.5 % IJ SOLN
INTRAMUSCULAR | Status: DC | PRN
  Administered 2012-12-21: 25 mL

## 2012-12-21 MED ORDER — OXYCODONE-ACETAMINOPHEN 5-325 MG PO TABS: 1.0000 | ORAL_TABLET | ORAL | Status: DC | PRN

## 2012-12-21 MED ORDER — OXYCODONE HCL 5 MG PO TABS
5.0000 mg | ORAL_TABLET | Freq: Once | ORAL | Status: AC | PRN
  Administered 2012-12-21: 5 mg via ORAL

## 2012-12-21 MED ORDER — MIDAZOLAM HCL 2 MG/2ML IJ SOLN
1.0000 mg | INTRAMUSCULAR | Status: DC | PRN
  Administered 2012-12-21: 2 mg via INTRAVENOUS

## 2012-12-21 MED ORDER — LACTATED RINGERS IV SOLN
INTRAVENOUS | Status: DC
  Administered 2012-12-21: 09:00:00 via INTRAVENOUS

## 2012-12-21 MED ORDER — PROPOFOL 10 MG/ML IV BOLUS
INTRAVENOUS | Status: DC | PRN
  Administered 2012-12-21: 200 mg via INTRAVENOUS

## 2012-12-21 MED ORDER — DEXTROSE 5 % IV SOLN: 3.0000 g | Freq: Once | INTRAVENOUS | Status: DC

## 2012-12-21 MED ORDER — LACTATED RINGERS IV SOLN
INTRAVENOUS | Status: DC
  Administered 2012-12-21: 08:00:00 via INTRAVENOUS

## 2012-12-21 MED ORDER — FENTANYL CITRATE 0.05 MG/ML IJ SOLN
50.0000 ug | INTRAMUSCULAR | Status: DC | PRN
  Administered 2012-12-21: 100 ug via INTRAVENOUS

## 2012-12-21 MED ORDER — CHLORHEXIDINE GLUCONATE 4 % EX LIQD: 60.0000 mL | Freq: Once | CUTANEOUS | Status: DC

## 2012-12-21 MED ORDER — CEFAZOLIN SODIUM-DEXTROSE 2-3 GM-% IV SOLR
2.0000 g | INTRAVENOUS | Status: DC
  Administered 2012-12-21: 2 g via INTRAVENOUS

## 2012-12-21 MED ORDER — FENTANYL CITRATE 0.05 MG/ML IJ SOLN
INTRAMUSCULAR | Status: DC | PRN
  Administered 2012-12-21 (×2): 25 ug via INTRAVENOUS

## 2012-12-21 MED ORDER — HYDROMORPHONE HCL PF 1 MG/ML IJ SOLN
0.2500 mg | INTRAMUSCULAR | Status: DC | PRN
  Administered 2012-12-21 (×2): 0.5 mg via INTRAVENOUS

## 2012-12-21 MED ORDER — METOCLOPRAMIDE HCL 5 MG/ML IJ SOLN: 10.0000 mg | Freq: Once | INTRAMUSCULAR | Status: DC | PRN

## 2012-12-21 MED ORDER — LIDOCAINE HCL (CARDIAC) 20 MG/ML IV SOLN
INTRAVENOUS | Status: DC | PRN
  Administered 2012-12-21: 80 mg via INTRAVENOUS

## 2012-12-21 MED ORDER — OXYCODONE HCL 5 MG/5ML PO SOLN: 5.0000 mg | Freq: Once | ORAL | Status: AC | PRN

## 2012-12-21 SURGICAL SUPPLY — 68 items
APL SKNCLS STERI-STRIP NONHPOA (GAUZE/BANDAGES/DRESSINGS) ×1
BANDAGE ELASTIC 6 VELCRO ST LF (GAUZE/BANDAGES/DRESSINGS) ×2 IMPLANT
BANDAGE ESMARK 6X9 LF (GAUZE/BANDAGES/DRESSINGS) ×1 IMPLANT
BENZOIN TINCTURE PRP APPL 2/3 (GAUZE/BANDAGES/DRESSINGS) ×2 IMPLANT
BLADE CUDA 5.5 (BLADE) IMPLANT
BLADE CUDA GRT WHITE 3.5 (BLADE) IMPLANT
BLADE CUTTER GATOR 3.5 (BLADE) ×2 IMPLANT
BLADE CUTTER MENIS 5.5 (BLADE) IMPLANT
BLADE GREAT WHITE 4.2 (BLADE) ×2 IMPLANT
BLADE SURG 15 STRL LF DISP TIS (BLADE) ×1 IMPLANT
BLADE SURG 15 STRL SS (BLADE) ×2
BNDG CMPR 9X6 STRL LF SNTH (GAUZE/BANDAGES/DRESSINGS) ×1
BNDG ESMARK 6X9 LF (GAUZE/BANDAGES/DRESSINGS) ×2
BUR OVAL 4.0 (BURR) IMPLANT
CANISTER OMNI JUG 16 LITER (MISCELLANEOUS) ×2 IMPLANT
CANISTER SUCTION 2500CC (MISCELLANEOUS) IMPLANT
CLOTH BEACON ORANGE TIMEOUT ST (SAFETY) ×2 IMPLANT
CUTTER MENISCUS  4.2MM (BLADE)
CUTTER MENISCUS 4.2MM (BLADE) IMPLANT
DRAPE ARTHROSCOPY W/POUCH 90 (DRAPES) ×2 IMPLANT
DRAPE U-SHAPE 47X51 STRL (DRAPES) ×2 IMPLANT
DURAPREP 26ML APPLICATOR (WOUND CARE) ×2 IMPLANT
ELECT MENISCUS 165MM 90D (ELECTRODE) IMPLANT
ELECT REM PT RETURN 9FT ADLT (ELECTROSURGICAL) ×2
ELECTRODE REM PT RTRN 9FT ADLT (ELECTROSURGICAL) ×1 IMPLANT
GAUZE XEROFORM 1X8 LF (GAUZE/BANDAGES/DRESSINGS) ×2 IMPLANT
GLOVE BIOGEL PI IND STRL 6.5 (GLOVE) IMPLANT
GLOVE BIOGEL PI IND STRL 7.0 (GLOVE) IMPLANT
GLOVE BIOGEL PI IND STRL 7.5 (GLOVE) IMPLANT
GLOVE BIOGEL PI IND STRL 8 (GLOVE) IMPLANT
GLOVE BIOGEL PI INDICATOR 6.5 (GLOVE) ×1
GLOVE BIOGEL PI INDICATOR 7.0 (GLOVE) ×1
GLOVE BIOGEL PI INDICATOR 7.5 (GLOVE) ×1
GLOVE BIOGEL PI INDICATOR 8 (GLOVE) ×1
GLOVE ORTHO TXT STRL SZ7.5 (GLOVE) ×4 IMPLANT
GOWN PREVENTION PLUS XLARGE (GOWN DISPOSABLE) ×4 IMPLANT
GOWN STRL REIN 2XL XLG LVL4 (GOWN DISPOSABLE) ×1 IMPLANT
IMMOBILIZER KNEE 22 UNIV (SOFTGOODS) IMPLANT
IMMOBILIZER KNEE 24 THIGH 36 (MISCELLANEOUS) IMPLANT
IMMOBILIZER KNEE 24 UNIV (MISCELLANEOUS) ×2
IV NS IRRIG 3000ML ARTHROMATIC (IV SOLUTION) ×6 IMPLANT
KNEE WRAP E Z 3 GEL PACK (MISCELLANEOUS) ×2 IMPLANT
NDL SUT 6 .5 CRC .975X.05 MAYO (NEEDLE) IMPLANT
NEEDLE MAYO TAPER (NEEDLE)
PACK ARTHROSCOPY DSU (CUSTOM PROCEDURE TRAY) ×2 IMPLANT
PACK BASIN DAY SURGERY FS (CUSTOM PROCEDURE TRAY) ×2 IMPLANT
PENCIL BUTTON HOLSTER BLD 10FT (ELECTRODE) ×2 IMPLANT
SET ARTHROSCOPY TUBING (MISCELLANEOUS) ×2
SET ARTHROSCOPY TUBING LN (MISCELLANEOUS) ×1 IMPLANT
SPONGE GAUZE 4X4 12PLY (GAUZE/BANDAGES/DRESSINGS) ×4 IMPLANT
SPONGE LAP 4X18 X RAY DECT (DISPOSABLE) ×2 IMPLANT
STRIP CLOSURE SKIN 1/2X4 (GAUZE/BANDAGES/DRESSINGS) ×2 IMPLANT
SUCTION FRAZIER TIP 10 FR DISP (SUCTIONS) ×2 IMPLANT
SUT ETHILON 3 0 PS 1 (SUTURE) ×2 IMPLANT
SUT FIBERWIRE #2 38 T-5 BLUE (SUTURE) ×2
SUT MNCRL AB 4-0 PS2 18 (SUTURE) ×1 IMPLANT
SUT MON AB 2-0 CT1 36 (SUTURE) ×1 IMPLANT
SUT VIC AB 0 CT1 27 (SUTURE) ×2
SUT VIC AB 0 CT1 27XBRD ANBCTR (SUTURE) ×1 IMPLANT
SUT VIC AB 1 CT1 27 (SUTURE)
SUT VIC AB 1 CT1 27XBRD ANBCTR (SUTURE) ×4 IMPLANT
SUT VIC AB 3-0 FS2 27 (SUTURE) IMPLANT
SUT VIC AB 3-0 SH 27 (SUTURE) ×2
SUT VIC AB 3-0 SH 27X BRD (SUTURE) ×1 IMPLANT
SUTURE FIBERWR #2 38 T-5 BLUE (SUTURE) IMPLANT
TOWEL OR 17X24 6PK STRL BLUE (TOWEL DISPOSABLE) ×2 IMPLANT
WATER STERILE IRR 1000ML POUR (IV SOLUTION) ×2 IMPLANT
YANKAUER SUCT BULB TIP NO VENT (SUCTIONS) ×2 IMPLANT

## 2012-12-21 NOTE — Anesthesia Postprocedure Evaluation (Signed)
Anesthesia Post Note  Patient: Cesar [name]  Procedure(s) Performed: Procedure(s) (LRB): LEFT KNEE ARTHROSCOPY WITH MEDIAL and LATERALMENISECTOMY AND QUARDRICEP TENDON REPAIR (Left)  Anesthesia type: General  Patient location: PACU  Post pain: Pain level controlled  Post assessment: Patient's Cardiovascular Status Stable  Last Vitals:  Filed Vitals:   12/21/12 1200  BP: 137/91  Pulse: 57  Temp:   Resp: 15    Post vital signs: Reviewed and stable  Level of consciousness: alert  Complications: No apparent anesthesia complications

## 2012-12-21 NOTE — Progress Notes (Signed)
Assisted Dr. Frederick with left, ultrasound guided, femoral block. Side rails up, monitors on throughout procedure. See vital signs in flow sheet. Tolerated Procedure well. 

## 2012-12-21 NOTE — Anesthesia Preprocedure Evaluation (Signed)
Anesthesia Evaluation  Patient identified by MRN, date of birth, ID band Patient awake    Reviewed: Allergy & Precautions, H&P , NPO status , Patient's Chart, lab work & pertinent test results, reviewed documented beta blocker date and time   Airway Mallampati: II TM Distance: >3 FB Neck ROM: full    Dental   Pulmonary sleep apnea ,  breath sounds clear to auscultation        Cardiovascular hypertension, On Medications Rhythm:regular     Neuro/Psych negative neurological ROS  negative psych ROS   GI/Hepatic negative GI ROS, Neg liver ROS,   Endo/Other  negative endocrine ROS  Renal/GU negative Renal ROS  negative genitourinary   Musculoskeletal   Abdominal   Peds  Hematology negative hematology ROS (+)   Anesthesia Other Findings See surgeon's H&P   Reproductive/Obstetrics negative OB ROS                           Anesthesia Physical Anesthesia Plan  ASA: II  Anesthesia Plan: General   Post-op Pain Management:    Induction: Intravenous  Airway Management Planned: LMA  Additional Equipment:   Intra-op Plan:   Post-operative Plan:   Informed Consent: I have reviewed the patients History and Physical, chart, labs and discussed the procedure including the risks, benefits and alternatives for the proposed anesthesia with the patient or authorized representative who has indicated his/her understanding and acceptance.   Dental Advisory Given  Plan Discussed with: CRNA and Surgeon  Anesthesia Plan Comments:         Anesthesia Quick Evaluation

## 2012-12-21 NOTE — Interval H&P Note (Signed)
History and Physical Interval Note:  12/21/2012 7:26 AM  Cesar [name]  has presented today for surgery, with the diagnosis of LEFT KNEE DISLOCATION, MEDIAL MENISCAL TEAR, PATELLA TENDON RUPTURE NON TRAUMATIC  The various methods of treatment have been discussed with the patient and family. After consideration of risks, benefits and other options for treatment, the patient has consented to  Procedure(s): LEFT KNEE ARTHROSCOPY WITH MEDIAL MENISECTOMY AND QUARDRICEP TENDON REPAIR (Left) as a surgical intervention .  The patient's history has been reviewed, patient examined, no change in status, stable for surgery.  I have reviewed the patient's chart and labs.  Questions were answered to the patient's satisfaction.     Nour Scalise F

## 2012-12-21 NOTE — Anesthesia Procedure Notes (Addendum)
Anesthesia Regional Block:  Femoral nerve block  Pre-Anesthetic Checklist: ,, timeout performed, Correct Patient, Correct Site, Correct Laterality, Correct Procedure, Correct Position, site marked, Risks and benefits discussed,  Surgical consent,  Pre-op evaluation,  At surgeon's request and post-op pain management  Laterality: Left  Prep: chloraprep       Needles:   Needle Type: Other     Needle Length: 9cm  Needle Gauge: 21    Additional Needles:  Procedures: ultrasound guided (picture in chart) Femoral nerve block Narrative:  Start time: 12/21/2012 7:47 AM End time: 12/21/2012 7:51 AM Injection made incrementally with aspirations every 5 mL.  Performed by: Personally  Anesthesiologist: C. Frederick MD  Additional Notes: Ultrasound guidance used to: id relevant anatomy, confirm needle position, local anesthetic spread, avoidance of vascular puncture. Picture saved. No complications. Block performed personally by Janetta Hora. Gelene Mink, MD    Femoral nerve block Procedure Name: LMA Insertion Performed by: York Grice Pre-anesthesia Checklist: Patient identified, Timeout performed, Emergency Drugs available, Suction available and Patient being monitored Patient Re-evaluated:Patient Re-evaluated prior to inductionOxygen Delivery Method: Circle system utilized Preoxygenation: Pre-oxygenation with 100% oxygen Intubation Type: IV induction Ventilation: Mask ventilation without difficulty LMA: LMA inserted LMA Size: 5.0 Number of attempts: 1 Placement Confirmation: breath sounds checked- equal and bilateral and positive ETCO2 Tube secured with: Tape Dental Injury: Teeth and Oropharynx as per pre-operative assessment

## 2012-12-21 NOTE — Transfer of Care (Signed)
Immediate Anesthesia Transfer of Care Note  Patient: Cesar [name]  Procedure(s) Performed: Procedure(s): LEFT KNEE ARTHROSCOPY WITH MEDIAL and LATERALMENISECTOMY AND QUARDRICEP TENDON REPAIR (Left)  Patient Location: PACU  Anesthesia Type:General  Level of Consciousness: awake and alert   Airway & Oxygen Therapy: Patient Spontanous Breathing and Patient connected to face mask oxygen  Post-op Assessment: Report given to PACU RN and Post -op Vital signs reviewed and stable  Post vital signs: Reviewed and stable  Complications: No apparent anesthesia complications

## 2012-12-22 ENCOUNTER — Encounter (HOSPITAL_BASED_OUTPATIENT_CLINIC_OR_DEPARTMENT_OTHER): Payer: Self-pay | Admitting: Orthopedic Surgery

## 2012-12-22 NOTE — Op Note (Signed)
NAME:  Cesar [name], Cesar [name] NO.:  [id number]  MEDICAL RECORD NO.:  [id number]  LOCATION:                               FACILITY:  [facility]  PHYSICIAN:  Loreta Ave, M.D. DATE OF BIRTH:  1955-07-31  DATE OF PROCEDURE:  12/21/2012 DATE OF DISCHARGE:  12/21/2012                              OPERATIVE REPORT   PREOPERATIVE DIAGNOSIS:  Left knee medial meniscus tear.  Extensive partial tearing, distal quadriceps tendon, lateral half, just above the patella.  POSTOPERATIVE DIAGNOSIS:  Left knee medial meniscus tear.  Extensive partial tearing, distal quadriceps tendon, lateral half, just above the patella with also radial tearing of midportion, lateral meniscus.  PROCEDURE:  Right knee exam under anesthesia, arthroscopy.  Debridement of medial and lateral meniscus.  Open debridement and primary repair of quadriceps tendon utilizing side-to-side suture with FiberWire.  SURGEON:  Loreta Ave, M.D.  ASSISTANT:  Margarita Rana, M.D. present throughout the entire case, necessary for timely completion of procedure.  ANESTHESIA:  General.  ESTIMATED BLOOD LOSS:  Minimal.  SPECIMENS:  None.  CULTURES:  None.  COMPLICATION:  None.  DRESSING:  Soft compressive knee immobilizer.  TOURNIQUET TIME:  45 minutes.  DESCRIPTION OF PROCEDURE:  The patient was brought to operating room, placed on operating table in supine position.  After adequate anesthesia had been obtained, tourniquet applied.  Prepped and draped in the usual sterile fashion.  Exsanguinated with elevation of Esmarch.  Tourniquet inflated to 350 mmHg.  Two portals, one each medial and lateral parapatellar.  Arthroscope was introduced.  Knee distended and inspected.  Articular cartilage looked fairly good throughout the entire knee.  Looking above the patella, you could see the tearing of the quad tendon lateral half with a V-shape tear extending up about 5-7 cm.  The medial side looked better.   Remaining knee examined.  Complex tearing of the medial meniscus posterior third.  Taken down to a stable rim, tapered into remaining meniscus.  Radial tearing, lateral meniscus saucerized and tapered smoothly.  Cruciate ligaments intact. Instruments were completely removed.  I then made a longitudinal incision from patella extending proximally.  Skin and subcutaneous tissues divided.  Quadriceps tendon exposed.  The area of extensive partial tearing, mostly on the lateral half.  This was opened up longitudinally exposing the extensive partial tearing as well as the anterior tendinous tearing inflammatory debride tendinopathy.  This involved a considerable amount of the lateral half of the tendon.  All abnormal tissue was debrided.  I excised a linear area about 1 cm wide extending up to 5 cm bringing it back to healthy tissue throughout.  I still had good longitudinal continuity of the tendon to the top of the patella over more than two-thirds.  I felt like all of the area of injury, abnormal tissue had been sufficiently debrided.  The wound was thoroughly irrigated.  Able to repair this side-to-side after debridement with a running FiberWire suture that was then buried within the repair.  When that was complete, I had good repair, excision of abnormal tissue, good continuity throughout, and I could bring it through full passive motion.  Wound irrigated.  I closed subcutaneous subcuticular.  Portals were closed with nylon.  Sterile compressive dressing applied.  Tourniquet deflated and removed.  Knee immobilizer applied.  Anesthesia reversed.  Brought to the recovery room.  Tolerated the surgery well.  No complications.     Loreta Ave, M.D.     DFM/MEDQ  D:  12/21/2012  T:  12/22/2012  Job:  [id number]

## 2013-02-05 ENCOUNTER — Ambulatory Visit (AMBULATORY_SURGERY_CENTER): Payer: BC Managed Care – PPO | Admitting: *Deleted

## 2013-02-05 ENCOUNTER — Encounter: Payer: Self-pay | Admitting: Internal Medicine

## 2013-02-05 VITALS — Ht 69.0 in | Wt 222.2 lb

## 2013-02-05 DIAGNOSIS — Z1211 Encounter for screening for malignant neoplasm of colon: Secondary | ICD-10-CM

## 2013-02-05 MED ORDER — MOVIPREP 100 G PO SOLR: ORAL | Status: DC

## 2013-02-05 NOTE — Progress Notes (Signed)
Patient denies any allergies to eggs or soy. Patient denies any problems with anesthesia.  

## 2013-02-19 ENCOUNTER — Ambulatory Visit (AMBULATORY_SURGERY_CENTER): Payer: BC Managed Care – PPO | Admitting: Internal Medicine

## 2013-02-19 ENCOUNTER — Encounter: Payer: Self-pay | Admitting: Internal Medicine

## 2013-02-19 VITALS — BP 108/71 | HR 61 | Temp 97.5°F | Resp 46 | Ht 69.0 in | Wt 222.0 lb

## 2013-02-19 DIAGNOSIS — D126 Benign neoplasm of colon, unspecified: Secondary | ICD-10-CM

## 2013-02-19 DIAGNOSIS — Z1211 Encounter for screening for malignant neoplasm of colon: Secondary | ICD-10-CM

## 2013-02-19 MED ORDER — SODIUM CHLORIDE 0.9 % IV SOLN: 500.0000 mL | INTRAVENOUS | Status: DC

## 2013-02-19 NOTE — Patient Instructions (Addendum)
Discharge instructions given with verbal understanding. Handouts on polyps and diverticulosis. Resume previous medications. YOU HAD AN ENDOSCOPIC PROCEDURE TODAY AT THE [facility]: Refer to the procedure report that was given to you for any specific questions about what was found during the examination.  If the procedure report does not answer your questions, please call your gastroenterologist to clarify.  If you requested that your care partner not be given the details of your procedure findings, then the procedure report has been included in a sealed envelope for you to review at your convenience later.  YOU SHOULD EXPECT: Some feelings of bloating in the abdomen. Passage of more gas than usual.  Walking can help get rid of the air that was put into your GI tract during the procedure and reduce the bloating. If you had a lower endoscopy (such as a colonoscopy or flexible sigmoidoscopy) you may notice spotting of blood in your stool or on the toilet paper. If you underwent a bowel prep for your procedure, then you may not have a normal bowel movement for a few days.  DIET: Your first meal following the procedure should be a light meal and then it is ok to progress to your normal diet.  A half-sandwich or bowl of soup is an example of a good first meal.  Heavy or fried foods are harder to digest and may make you feel nauseous or bloated.  Likewise meals heavy in dairy and vegetables can cause extra gas to form and this can also increase the bloating.  Drink plenty of fluids but you should avoid alcoholic beverages for 24 hours.  ACTIVITY: Your care partner should take you home directly after the procedure.  You should plan to take it easy, moving slowly for the rest of the day.  You can resume normal activity the day after the procedure however you should NOT DRIVE or use heavy machinery for 24 hours (because of the sedation medicines used during the test).    SYMPTOMS TO REPORT  IMMEDIATELY: A gastroenterologist can be reached at any hour.  During normal business hours, 8:30 AM to 5:00 PM Monday through Friday, call [phone].  After hours and on weekends, please call the GI answering service at [phone] who will take a message and have the physician on call contact you.   Following lower endoscopy (colonoscopy or flexible sigmoidoscopy):  Excessive amounts of blood in the stool  Significant tenderness or worsening of abdominal pains  Swelling of the abdomen that is new, acute  Fever of 100F or higher  FOLLOW UP: If any biopsies were taken you will be contacted by phone or by letter within the next 1-3 weeks.  Call your gastroenterologist if you have not heard about the biopsies in 3 weeks.  Our staff will call the home number listed on your records the next business day following your procedure to check on you and address any questions or concerns that you may have at that time regarding the information given to you following your procedure. This is a courtesy call and so if there is no answer at the home number and we have not heard from you through the emergency physician on call, we will assume that you have returned to your regular daily activities without incident.  SIGNATURES/CONFIDENTIALITY: You and/or your care partner have signed paperwork which will be entered into your electronic medical record.  These signatures attest to the fact that that the information above on your After Visit Summary   has been reviewed and is understood.  Full responsibility of the confidentiality of this discharge information lies with you and/or your care-partner. 

## 2013-02-19 NOTE — Op Note (Signed)
[facility] [address] [place], [address]   COLONOSCOPY PROCEDURE REPORT  PATIENT: Donney, Caraveo  MR#: [id number] BIRTHDATE: 1956/01/13 , 57  yrs. old GENDER: Male ENDOSCOPIST: Beverley Fiedler, MD REFERRED HQ:IONGE Amador Cunas, M.D. PROCEDURE DATE:  02/19/2013 PROCEDURE:   Colonoscopy with cold biopsy polypectomy and Colonoscopy with snare polypectomy First Screening Colonoscopy - Avg.  risk and is 50 yrs.  old or older Yes.  Prior Negative Screening - Now for repeat screening. N/A  History of Adenoma - Now for follow-up colonoscopy & has been > or = to 3 yrs.  N/A  Polyps Removed Today? Yes. ASA CLASS:   Class II INDICATIONS:average risk screening and first colonoscopy. MEDICATIONS: MAC sedation, administered by CRNA and propofol (Diprivan) 200mg  IV  DESCRIPTION OF PROCEDURE:   After the risks benefits and alternatives of the procedure were thoroughly explained, informed consent was obtained.  A digital rectal exam revealed no rectal mass.   The LB [id number]  endoscope was introduced through the anus and advanced to the cecum, which was identified by both the appendix and ileocecal valve. No adverse events experienced. The quality of the prep was good, using MoviPrep  The instrument was then slowly withdrawn as the colon was fully examined.    COLON FINDINGS: Five sessile polyps ranging between 3-31mm in size were found in the ascending colon (2), at the hepatic flexure (1), and in the transverse colon (2).  Polypectomy was performed with cold forceps (4) and using cold snare (1).  All resections were complete and all polyp tissue was completely retrieved.   Mild diverticulosis was noted at the hepatic flexure, in the descending colon, and sigmoid colon.  Retroflexed views revealed no abnormalities. The time to cecum=1 minutes 28 seconds.  Withdrawal time=11 minutes 45 seconds.  The scope was withdrawn and the procedure  completed.  COMPLICATIONS: There were no complications.   ENDOSCOPIC IMPRESSION: 1.   Five sessile polyps ranging between 3-46mm in size were found in the ascending colon, at the hepatic flexure, and in the transverse colon; Polypectomy was performed with cold forceps and using cold snare 2.   Mild diverticulosis was noted at the hepatic flexure, in the descending colon, and sigmoid colon  RECOMMENDATIONS: 1.  Await pathology results 2.  High fiber diet 3.  If the polyps removed today are proven to be adenomatous (pre-cancerous) polyps, you will need a colonoscopy in 3 years. Otherwise you should continue to follow colorectal cancer screening guidelines for "routine risk" patients with a colonoscopy in 10 years.  You will receive a letter within 1-2 weeks with the results of your biopsy as well as final recommendations.  Please call my office if you have not received a letter after 3 weeks.   eSigned:  Beverley Fiedler, MD 02/19/2013 11:16 AM   cc: The Patient and Gordy Savers, MD   PATIENT NAME:  Curley, Hogen MR#: [id number]

## 2013-02-19 NOTE — Progress Notes (Signed)
Patient did not experience any of the following events: a burn prior to discharge; a fall within the facility; wrong site/side/patient/procedure/implant event; or a hospital transfer or hospital admission upon discharge from the facility. (G8907) Patient did not have preoperative order for IV antibiotic SSI prophylaxis. (G8918)  

## 2013-02-19 NOTE — Progress Notes (Signed)
Called to room to assist during endoscopic procedure.  Patient ID and intended procedure confirmed with present staff. Received instructions for my participation in the procedure from the performing physician.  

## 2013-02-19 NOTE — Progress Notes (Signed)
Report to pacu rn, vss, bbs=clear 

## 2013-02-20 ENCOUNTER — Telehealth: Payer: Self-pay | Admitting: *Deleted

## 2013-02-20 NOTE — Telephone Encounter (Signed)
No answer, message left for the patient. 

## 2013-02-20 NOTE — Telephone Encounter (Signed)
  Follow up Call-  Call back number 02/19/2013  Post procedure Call Back phone  # [phone]  Permission to leave phone message Yes     Patient questions:  Do you have a fever, pain , or abdominal swelling? no Pain Score  0 *  Have you tolerated food without any problems? yes  Have you been able to return to your normal activities? yes  Do you have any questions about your discharge instructions: Diet   no Medications  no Follow up visit  no  Do you have questions or concerns about your Care? no  Actions: * If pain score is 4 or above: No action needed, pain <4.

## 2013-02-22 ENCOUNTER — Encounter: Payer: Self-pay | Admitting: Internal Medicine

## 2013-03-17 ENCOUNTER — Other Ambulatory Visit: Payer: Self-pay | Admitting: Internal Medicine

## 2013-03-21 ENCOUNTER — Other Ambulatory Visit: Payer: Self-pay | Admitting: *Deleted

## 2013-03-21 NOTE — Telephone Encounter (Signed)
Rx for Androderm faxed to pharmacy. 

## 2013-04-05 ENCOUNTER — Other Ambulatory Visit: Payer: Self-pay

## 2013-06-14 ENCOUNTER — Other Ambulatory Visit: Payer: Self-pay | Admitting: Internal Medicine

## 2013-11-15 ENCOUNTER — Other Ambulatory Visit: Payer: Self-pay | Admitting: Internal Medicine

## 2013-11-19 ENCOUNTER — Other Ambulatory Visit: Payer: BC Managed Care – PPO

## 2013-11-20 ENCOUNTER — Telehealth: Payer: Self-pay | Admitting: Internal Medicine

## 2013-11-20 ENCOUNTER — Other Ambulatory Visit: Payer: Self-pay | Admitting: Internal Medicine

## 2013-11-20 DIAGNOSIS — I10 Essential (primary) hypertension: Secondary | ICD-10-CM

## 2013-11-20 DIAGNOSIS — Z0000 Encounter for general adult medical examination without abnormal findings: Secondary | ICD-10-CM

## 2013-11-20 DIAGNOSIS — E291 Testicular hypofunction: Secondary | ICD-10-CM

## 2013-11-20 DIAGNOSIS — E785 Hyperlipidemia, unspecified: Secondary | ICD-10-CM

## 2013-11-20 NOTE — Telephone Encounter (Signed)
Cesar [name], lab orders done and in [facility]

## 2013-11-20 NOTE — Telephone Encounter (Signed)
Pt missed his cpx labs for this past mon. I called pt and resc. However, pt will need psa labs done bc his androgel was refused. Can you put order for these labs in for his am lab appt?

## 2013-11-21 ENCOUNTER — Other Ambulatory Visit: Payer: BC Managed Care – PPO

## 2013-11-21 ENCOUNTER — Other Ambulatory Visit (INDEPENDENT_AMBULATORY_CARE_PROVIDER_SITE_OTHER): Payer: BC Managed Care – PPO

## 2013-11-21 DIAGNOSIS — E785 Hyperlipidemia, unspecified: Secondary | ICD-10-CM

## 2013-11-21 DIAGNOSIS — I10 Essential (primary) hypertension: Secondary | ICD-10-CM

## 2013-11-21 DIAGNOSIS — E291 Testicular hypofunction: Secondary | ICD-10-CM

## 2013-11-21 DIAGNOSIS — Z0000 Encounter for general adult medical examination without abnormal findings: Secondary | ICD-10-CM

## 2013-11-21 LAB — BASIC METABOLIC PANEL
BUN: 13 mg/dL (ref 6–23)
CHLORIDE: 101 meq/L (ref 96–112)
CO2: 31 mEq/L (ref 19–32)
CREATININE: 1 mg/dL (ref 0.4–1.5)
Calcium: 9.2 mg/dL (ref 8.4–10.5)
GFR: 98.37 mL/min (ref >60.00)
Glucose, Bld: 99 mg/dL (ref 70–99)
Potassium: 4.5 mEq/L (ref 3.5–5.1)
Sodium: 138 mEq/L (ref 135–145)

## 2013-11-21 LAB — LIPID PANEL
CHOL/HDL RATIO: 5
Cholesterol: 181 mg/dL (ref 0–200)
HDL: 37.8 mg/dL — ABNORMAL LOW (ref >39.00)
LDL CALC: 128 mg/dL — AB (ref 0–99)
NONHDL: 143.2
Triglycerides: 77 mg/dL (ref 0.0–149.0)
VLDL: 15.4 mg/dL (ref 0.0–40.0)

## 2013-11-21 LAB — CBC WITH DIFFERENTIAL/PLATELET
BASOS PCT: 0.3 % (ref 0.0–3.0)
Basophils Absolute: 0 10*3/uL (ref 0.0–0.1)
EOS ABS: 0.3 10*3/uL (ref 0.0–0.7)
Eosinophils Relative: 3.3 % (ref 0.0–5.0)
HEMATOCRIT: 41.7 % (ref 39.0–52.0)
HEMOGLOBIN: 14.1 g/dL (ref 13.0–17.0)
LYMPHS PCT: 26.6 % (ref 12.0–46.0)
Lymphs Abs: 2.7 10*3/uL (ref 0.7–4.0)
MCHC: 34 g/dL (ref 30.0–36.0)
MCV: 92.8 fl (ref 78.0–100.0)
Monocytes Absolute: 1.1 10*3/uL — ABNORMAL HIGH (ref 0.1–1.0)
Monocytes Relative: 10.5 % (ref 3.0–12.0)
NEUTROS ABS: 6 10*3/uL (ref 1.4–7.7)
Neutrophils Relative %: 59.3 % (ref 43.0–77.0)
Platelets: 207 10*3/uL (ref 150.0–400.0)
RBC: 4.49 Mil/uL (ref 4.22–5.81)
RDW: 13 % (ref 11.5–15.5)
WBC: 10.2 10*3/uL (ref 4.0–10.5)

## 2013-11-21 LAB — HEPATIC FUNCTION PANEL
ALT: 26 U/L (ref 0–53)
AST: 25 U/L (ref 0–37)
Albumin: 3.6 g/dL (ref 3.5–5.2)
Alkaline Phosphatase: 81 U/L (ref 39–117)
BILIRUBIN DIRECT: 0.1 mg/dL (ref 0.0–0.3)
BILIRUBIN TOTAL: 0.6 mg/dL (ref 0.2–1.2)
TOTAL PROTEIN: 7.3 g/dL (ref 6.0–8.3)

## 2013-11-21 LAB — POCT URINALYSIS DIPSTICK
BILIRUBIN UA: NEGATIVE
GLUCOSE UA: NEGATIVE
Ketones, UA: NEGATIVE
Leukocytes, UA: NEGATIVE
Nitrite, UA: NEGATIVE
Protein, UA: NEGATIVE
RBC UA: NEGATIVE
SPEC GRAV UA: 1.02
UROBILINOGEN UA: 0.2
pH, UA: 6

## 2013-11-21 LAB — PSA: PSA: 0.58 ng/mL (ref 0.10–4.00)

## 2013-11-21 LAB — TESTOSTERONE: Testosterone: 141.22 ng/dL — ABNORMAL LOW (ref 300.00–890.00)

## 2013-11-21 LAB — TSH: TSH: 1.69 u[IU]/mL (ref 0.35–4.50)

## 2013-11-26 ENCOUNTER — Ambulatory Visit (INDEPENDENT_AMBULATORY_CARE_PROVIDER_SITE_OTHER): Payer: BC Managed Care – PPO | Admitting: Internal Medicine

## 2013-11-26 ENCOUNTER — Encounter: Payer: Self-pay | Admitting: Internal Medicine

## 2013-11-26 VITALS — BP 120/80 | HR 73 | Temp 98.4°F | Resp 18 | Ht 68.25 in | Wt 220.0 lb

## 2013-11-26 DIAGNOSIS — Z23 Encounter for immunization: Secondary | ICD-10-CM

## 2013-11-26 DIAGNOSIS — E291 Testicular hypofunction: Secondary | ICD-10-CM

## 2013-11-26 DIAGNOSIS — Z0000 Encounter for general adult medical examination without abnormal findings: Secondary | ICD-10-CM

## 2013-11-26 DIAGNOSIS — G4730 Sleep apnea, unspecified: Secondary | ICD-10-CM

## 2013-11-26 DIAGNOSIS — E785 Hyperlipidemia, unspecified: Secondary | ICD-10-CM

## 2013-11-26 DIAGNOSIS — I10 Essential (primary) hypertension: Secondary | ICD-10-CM

## 2013-11-26 MED ORDER — TESTOSTERONE 50 MG/5GM (1%) TD GEL: TRANSDERMAL | Status: DC

## 2013-11-26 MED ORDER — LISINOPRIL-HYDROCHLOROTHIAZIDE 20-12.5 MG PO TABS: ORAL_TABLET | ORAL | Status: DC

## 2013-11-26 NOTE — Progress Notes (Signed)
Subjective:    Patient ID: Cesar [name], male    DOB: 1956/01/08, 58 y.o.   MRN: [id number]  HPI 57 year old patient who is seen today for a health maintenance examination.  Medical problems include treated hypertension. He also has a history of testosterone deficiency and OS A. He has done quite well. Approximately one year ago.  He underwent surgery of the left knee.  His father has a long history of cerebral vascular disease and has died;  family history is positive for hypertension   colonoscopy 2014   Past Medical History  Diagnosis Date  . HYPERTENSION 01/15/2008  . SHOULDER, PAIN 01/15/2008  . TESTOSTERONE DEFICIENCY 05/21/2008  . Sleep apnea     uses a cpap    History   Social History  . Marital Status: Married    Spouse Name: N/A    Number of Children: N/A  . Years of Education: N/A   Occupational History  . Not on file.   Social History Main Topics  . Smoking status: Never Smoker   . Smokeless tobacco: Never Used  . Alcohol Use: No  . Drug Use: No  . Sexual Activity: Not on file   Other Topics Concern  . Not on file   Social History Narrative  . No narrative on file    Past Surgical History  Procedure Laterality Date  . Appendectomy    . Hernia repair      lt ing hernia age 49  . Tonsillectomy    . Plantar fascia surgery  7/10    left heel  . Knee arthroscopy with patellar tendon repair Left 12/21/2012    Procedure: LEFT KNEE ARTHROSCOPY WITH MEDIAL and LATERALMENISECTOMY AND QUARDRICEP TENDON REPAIR;  Surgeon: Ninetta Lights, MD;  Location: [place];  Service: Orthopedics;  Laterality: Left;    Family History  Problem Relation Age of Onset  . Colon cancer Neg Hx   . Stomach cancer Neg Hx     No Known Allergies  Current Outpatient Prescriptions on File Prior to Visit  Medication Sig Dispense Refill  . ANDROGEL 50 MG/5GM GEL apply 2 packets TO THE SKIN daily  300 g  2   No current facility-administered medications on file  prior to visit.    BP 120/80  Pulse 73  Temp(Src) 98.4 F (36.9 C) (Oral)  Resp 18  Ht 5' 8.25" (1.734 m)  Wt 220 lb (99.791 kg)  BMI 33.19 kg/m2  SpO2 97%      Review of Systems  Constitutional: Negative for fever, chills, activity change, appetite change and fatigue.  HENT: Negative for congestion, dental problem, ear pain, hearing loss, mouth sores, rhinorrhea, sinus pressure, sneezing, tinnitus, trouble swallowing and voice change.   Eyes: Negative for photophobia, pain, redness and visual disturbance.  Respiratory: Negative for apnea, cough, choking, chest tightness, shortness of breath and wheezing.   Cardiovascular: Negative for chest pain, palpitations and leg swelling.  Gastrointestinal: Negative for nausea, vomiting, abdominal pain, diarrhea, constipation, blood in stool, abdominal distention, anal bleeding and rectal pain.  Genitourinary: Negative for dysuria, urgency, frequency, hematuria, flank pain, decreased urine volume, discharge, penile swelling, scrotal swelling, difficulty urinating, genital sores and testicular pain.  Musculoskeletal: Negative for arthralgias, back pain, gait problem, joint swelling, myalgias, neck pain and neck stiffness.  Skin: Negative for color change, rash and wound.  Neurological: Negative for dizziness, tremors, seizures, syncope, facial asymmetry, speech difficulty, weakness, light-headedness, numbness and headaches.  Hematological: Negative for adenopathy. Does not bruise/bleed  easily.  Psychiatric/Behavioral: Negative for suicidal ideas, hallucinations, behavioral problems, confusion, sleep disturbance, self-injury, dysphoric mood, decreased concentration and agitation. The patient is not nervous/anxious.        Objective:   Physical Exam  Constitutional: He appears well-developed and well-nourished.  HENT:  Head: Normocephalic and atraumatic.  Right Ear: External ear normal.  Left Ear: External ear normal.  Nose: Nose normal.   Mouth/Throat: Oropharynx is clear and moist.  Early arcus senilis  Eyes: Conjunctivae and EOM are normal. Pupils are equal, round, and reactive to light. No scleral icterus.  Neck: Normal range of motion. Neck supple. No JVD present. No thyromegaly present.  Cardiovascular: Normal rate, regular rhythm, normal heart sounds and intact distal pulses.  Exam reveals no gallop and no friction rub.   No murmur heard. Left dorsalis pedis pulse diminished  Pulmonary/Chest: Effort normal and breath sounds normal. He exhibits no tenderness.  Abdominal: Soft. Bowel sounds are normal. He exhibits no distension and no mass. There is no tenderness.  Genitourinary: Prostate normal and penis normal.  Musculoskeletal: Normal range of motion. He exhibits no edema and no tenderness.  Surgical scar, left knee  Lymphadenopathy:    He has no cervical adenopathy.  Neurological: He is alert. He has normal reflexes. No cranial nerve deficit. Coordination normal.  Skin: Skin is warm and dry. No rash noted.  Small callus versus plantar wart involving the arch of the left foot  Psychiatric: He has a normal mood and affect. His behavior is normal.          Assessment & Plan:   Preventive health examination Hypertension well controlled Testosterone deficiency ED OSA  continue home CPAP  Medical regimen unchanged. There's been some modest weight loss. Additional weight loss exercise regimen encouraged return in one year or as needed

## 2013-11-26 NOTE — Progress Notes (Signed)
Pre-visit discussion using our clinic review tool. No additional management support is needed unless otherwise documented below in the visit note.  

## 2013-11-26 NOTE — Patient Instructions (Signed)

## 2013-11-26 NOTE — Progress Notes (Signed)
   Subjective:    Patient ID: Cesar [name], male    DOB: Dec 09, 1955, 58 y.o.   MRN: [id number]  HPI  Wt Readings from Last 3 Encounters:  11/26/13 220 lb (99.791 kg)  02/19/13 222 lb (100.699 kg)  02/05/13 222 lb 3.2 oz (100.789 kg)    Review of Systems     Objective:   Physical Exam        Assessment & Plan:

## 2013-11-27 ENCOUNTER — Telehealth: Payer: Self-pay | Admitting: Internal Medicine

## 2013-11-27 NOTE — Telephone Encounter (Signed)
Relevant patient education assigned to patient using Emmi. ° °

## 2014-04-23 ENCOUNTER — Observation Stay (HOSPITAL_COMMUNITY): Payer: BC Managed Care – PPO

## 2014-04-23 ENCOUNTER — Encounter (HOSPITAL_COMMUNITY): Payer: Self-pay | Admitting: Emergency Medicine

## 2014-04-23 ENCOUNTER — Telehealth: Payer: Self-pay | Admitting: Internal Medicine

## 2014-04-23 ENCOUNTER — Emergency Department (HOSPITAL_COMMUNITY): Payer: BC Managed Care – PPO

## 2014-04-23 ENCOUNTER — Observation Stay (HOSPITAL_COMMUNITY)
Admission: EM | Admit: 2014-04-23 | Discharge: 2014-04-24 | Disposition: A | Discharge status: Home / Self Care | Payer: BC Managed Care – PPO | Attending: Internal Medicine | Admitting: Internal Medicine

## 2014-04-23 DIAGNOSIS — G459 Transient cerebral ischemic attack, unspecified: Principal | ICD-10-CM

## 2014-04-23 DIAGNOSIS — I10 Essential (primary) hypertension: Secondary | ICD-10-CM | POA: Diagnosis not present

## 2014-04-23 DIAGNOSIS — Z7982 Long term (current) use of aspirin: Secondary | ICD-10-CM | POA: Insufficient documentation

## 2014-04-23 DIAGNOSIS — E291 Testicular hypofunction: Secondary | ICD-10-CM | POA: Diagnosis not present

## 2014-04-23 DIAGNOSIS — M13811 Other specified arthritis, right shoulder: Secondary | ICD-10-CM | POA: Insufficient documentation

## 2014-04-23 DIAGNOSIS — G4733 Obstructive sleep apnea (adult) (pediatric): Secondary | ICD-10-CM | POA: Diagnosis not present

## 2014-04-23 DIAGNOSIS — M13812 Other specified arthritis, left shoulder: Secondary | ICD-10-CM | POA: Insufficient documentation

## 2014-04-23 DIAGNOSIS — G450 Vertebro-basilar artery syndrome: Secondary | ICD-10-CM

## 2014-04-23 DIAGNOSIS — I493 Ventricular premature depolarization: Secondary | ICD-10-CM | POA: Diagnosis not present

## 2014-04-23 DIAGNOSIS — R413 Other amnesia: Secondary | ICD-10-CM | POA: Diagnosis present

## 2014-04-23 DIAGNOSIS — I517 Cardiomegaly: Secondary | ICD-10-CM

## 2014-04-23 HISTORY — DX: Transient cerebral ischemic attack, unspecified: G45.9

## 2014-04-23 HISTORY — DX: Bursitis of unspecified shoulder: M75.50

## 2014-04-23 HISTORY — DX: Other shoulder lesions, unspecified shoulder: M75.80

## 2014-04-23 HISTORY — DX: Other enthesopathies, not elsewhere classified: M77.8

## 2014-04-23 HISTORY — DX: Obstructive sleep apnea (adult) (pediatric): G47.33

## 2014-04-23 HISTORY — DX: Dependence on other enabling machines and devices: Z99.89

## 2014-04-23 HISTORY — DX: Unspecified osteoarthritis, unspecified site: M19.90

## 2014-04-23 HISTORY — DX: Headache, unspecified: R51.9

## 2014-04-23 HISTORY — DX: Headache: R51

## 2014-04-23 LAB — I-STAT CHEM 8, ED
BUN: 13 mg/dL (ref 6–23)
CHLORIDE: 101 meq/L (ref 96–112)
CREATININE: 1 mg/dL (ref 0.50–1.35)
Calcium, Ion: 1.13 mmol/L (ref 1.12–1.23)
GLUCOSE: 116 mg/dL — AB (ref 70–99)
HCT: 46 % (ref 39.0–52.0)
Hemoglobin: 15.6 g/dL (ref 13.0–17.0)
POTASSIUM: 3.6 meq/L — AB (ref 3.7–5.3)
SODIUM: 138 meq/L (ref 137–147)
TCO2: 27 mmol/L (ref 0–100)

## 2014-04-23 LAB — COMPREHENSIVE METABOLIC PANEL
ALT: 26 U/L (ref 0–53)
AST: 27 U/L (ref 0–37)
Albumin: 3.6 g/dL (ref 3.5–5.2)
Alkaline Phosphatase: 80 U/L (ref 39–117)
Anion gap: 11 (ref 5–15)
BUN: 14 mg/dL (ref 6–23)
CO2: 28 meq/L (ref 19–32)
Calcium: 9.1 mg/dL (ref 8.4–10.5)
Chloride: 102 mEq/L (ref 96–112)
Creatinine, Ser: 0.96 mg/dL (ref 0.50–1.35)
GFR calc Af Amer: >90 mL/min (ref >90)
GFR calc non Af Amer: 89 mL/min — ABNORMAL LOW (ref >90)
Glucose, Bld: 110 mg/dL — ABNORMAL HIGH (ref 70–99)
Potassium: 3.7 mEq/L (ref 3.7–5.3)
SODIUM: 141 meq/L (ref 137–147)
TOTAL PROTEIN: 7.7 g/dL (ref 6.0–8.3)
Total Bilirubin: 0.3 mg/dL (ref 0.3–1.2)

## 2014-04-23 LAB — GLUCOSE, CAPILLARY
GLUCOSE-CAPILLARY: 143 mg/dL — AB (ref 70–99)
Glucose-Capillary: 110 mg/dL — ABNORMAL HIGH (ref 70–99)

## 2014-04-23 LAB — RAPID URINE DRUG SCREEN, HOSP PERFORMED
Amphetamines: NOT DETECTED
Barbiturates: NOT DETECTED
Benzodiazepines: NOT DETECTED
COCAINE: NOT DETECTED
Opiates: NOT DETECTED
TETRAHYDROCANNABINOL: NOT DETECTED

## 2014-04-23 LAB — URINALYSIS, ROUTINE W REFLEX MICROSCOPIC
BILIRUBIN URINE: NEGATIVE
Glucose, UA: NEGATIVE mg/dL
Hgb urine dipstick: NEGATIVE
KETONES UR: NEGATIVE mg/dL
Leukocytes, UA: NEGATIVE
NITRITE: NEGATIVE
PROTEIN: NEGATIVE mg/dL
Specific Gravity, Urine: 1.015 (ref 1.005–1.030)
Urobilinogen, UA: 0.2 mg/dL (ref 0.0–1.0)
pH: 6 (ref 5.0–8.0)

## 2014-04-23 LAB — CBC
HCT: 42.3 % (ref 39.0–52.0)
Hemoglobin: 14.6 g/dL (ref 13.0–17.0)
MCH: 31.1 pg (ref 26.0–34.0)
MCHC: 34.5 g/dL (ref 30.0–36.0)
MCV: 90.2 fL (ref 78.0–100.0)
PLATELETS: 186 10*3/uL (ref 150–400)
RBC: 4.69 MIL/uL (ref 4.22–5.81)
RDW: 12.7 % (ref 11.5–15.5)
WBC: 8.6 10*3/uL (ref 4.0–10.5)

## 2014-04-23 LAB — DIFFERENTIAL
BASOS ABS: 0 10*3/uL (ref 0.0–0.1)
Basophils Relative: 0 % (ref 0–1)
EOS ABS: 0.1 10*3/uL (ref 0.0–0.7)
Eosinophils Relative: 1 % (ref 0–5)
LYMPHS PCT: 24 % (ref 12–46)
Lymphs Abs: 2.1 10*3/uL (ref 0.7–4.0)
Monocytes Absolute: 0.4 10*3/uL (ref 0.1–1.0)
Monocytes Relative: 5 % (ref 3–12)
NEUTROS PCT: 70 % (ref 43–77)
Neutro Abs: 5.9 10*3/uL (ref 1.7–7.7)

## 2014-04-23 LAB — ETHANOL: Alcohol, Ethyl (B): <11 mg/dL (ref 0–11)

## 2014-04-23 LAB — I-STAT TROPONIN, ED: Troponin i, poc: 0 ng/mL (ref 0.00–0.08)

## 2014-04-23 LAB — PROTIME-INR
INR: 1.05 (ref 0.00–1.49)
PROTHROMBIN TIME: 13.8 s (ref 11.6–15.2)

## 2014-04-23 LAB — HEMOGLOBIN A1C
HEMOGLOBIN A1C: 6.3 % — AB (ref <5.7)
Mean Plasma Glucose: 134 mg/dL — ABNORMAL HIGH (ref <117)

## 2014-04-23 LAB — APTT: aPTT: 29 seconds (ref 24–37)

## 2014-04-23 MED ORDER — POTASSIUM CHLORIDE CRYS ER 20 MEQ PO TBCR
40.0000 meq | EXTENDED_RELEASE_TABLET | Freq: Once | ORAL | Status: AC
  Administered 2014-04-23: 40 meq via ORAL
  Filled 2014-04-23: qty 2

## 2014-04-23 MED ORDER — STROKE: EARLY STAGES OF RECOVERY BOOK
Freq: Once | Status: AC
  Administered 2014-04-23: 17:00:00
  Filled 2014-04-23: qty 1

## 2014-04-23 MED ORDER — SODIUM CHLORIDE 0.9 % IV BOLUS (SEPSIS)
500.0000 mL | Freq: Once | INTRAVENOUS | Status: AC
  Administered 2014-04-23: 500 mL via INTRAVENOUS

## 2014-04-23 MED ORDER — INFLUENZA VAC SPLIT QUAD 0.5 ML IM SUSY
0.5000 mL | PREFILLED_SYRINGE | INTRAMUSCULAR | Status: AC
  Administered 2014-04-24: 0.5 mL via INTRAMUSCULAR
  Filled 2014-04-23: qty 0.5

## 2014-04-23 MED ORDER — ENOXAPARIN SODIUM 40 MG/0.4ML ~~LOC~~ SOLN
40.0000 mg | SUBCUTANEOUS | Status: DC
  Administered 2014-04-23: 40 mg via SUBCUTANEOUS
  Filled 2014-04-23: qty 0.4

## 2014-04-23 MED ORDER — ASPIRIN EC 81 MG PO TBEC
81.0000 mg | DELAYED_RELEASE_TABLET | Freq: Every day | ORAL | Status: DC
  Administered 2014-04-23 – 2014-04-24 (×2): 81 mg via ORAL
  Filled 2014-04-23 (×2): qty 1

## 2014-04-23 NOTE — Procedures (Signed)
ELECTROENCEPHALOGRAM REPORT   Patient: Cesar [name]       Room #: A09 EEG No. ID: [id number] Age: 58 y.o.        Sex: male Referring Physician: Aram Beecham Report Date:  04/23/2014        Interpreting Physician: Alexis Goodell D  History: Cesar [name] is an 58 y.o. male with an episode of altered mental status  Medications:  Scheduled: .  stroke: mapping our early stages of recovery book   Does not apply Once  . aspirin EC  81 mg Oral Daily  . enoxaparin (LOVENOX) injection  40 mg Subcutaneous Q24H  . potassium chloride  40 mEq Oral Once    Conditions of Recording:  This is a 16 channel EEG carried out with the patient in the awake and drowsy states.  Description:  The waking background activity consists of a low voltage, symmetrical, fairly well organized, 9 Hz alpha activity, seen from the parieto-occipital and posterior temporal regions.  Low voltage fast activity, poorly organized, is seen anteriorly and is at times superimposed on more posterior regions.  A mixture of theta and alpha rhythms are seen from the central and temporal regions. The patient drowses with slowing to irregular, low voltage theta and beta activity.   Stage II sleep is not obtained. Hyperventilation was performed and produced a mild to moderate buildup but failed to elicit any abnormalities.  Intermittent photic stimulation was performed but failed to illicit any change in the tracing.    IMPRESSION: Normal electroencephalogram, awake, drowsy and with activation procedures. There are no focal lateralizing or epileptiform features.   Alexis Goodell, MD [facility] [phone] 04/23/2014, 2:11 PM

## 2014-04-23 NOTE — Code Documentation (Signed)
58yo male arriving to [facility] via private vehicle.  Patient reports that he was at his baseline upon waking this morning at 0500.  He went to the gym at 0600 and worked out.  After his workout he was in the locker room and became disoriented.  Staff at the [facility] checked him out and his SBP was reportedly 150.  He had brought his daughter to the [facility] for swim practice and was unaware that she was there.  He drove himself home and a family member brought him to the hospital.  Patient reports that the episode lasted 10-15 minutes.  He reports episodes of "lightheadedness" occuring weekly over the last year of which his primary MD is aware of.  He takes antihypertensives and reports that he was experiencing hypotension during the episodes, and that he now takes half a pill.  NIHSS 0 on arrival per ED RN.  CT scan completed.  Dr. Armida Sans to the bedside.  Patient will be a TIA alert.  Patient to be admitted for workup.  Bedside handoff with ED RN Santiago Glad.

## 2014-04-23 NOTE — H&P (Signed)
[facility] History and Physical  Cesar [name]:[id number] DOB: 04/07/1956 DOA: 04/23/2014  Referring physician: er PCP: Nyoka Cowden, MD   Chief Complaint: AMS  HPI: Cesar [name] is a 58 y.o. male  Who went to the gym this AM.  He worked out and as he was finished and was showering, he was noted to be disoriented.  He felt lightheaded and dizzy.  He was evaluated by gym staff- BP 150s.  EMS was called: no weakness or slurred speech.   Passed swallow eval in ER and symptoms have resolved. +HTN risk factor, + family Hx- dad with CVA  In 2012 seen by PCP for episodes of lightheadedness.   He was seen in the ER by Neurology who recommended TIA work up.  CT head negative.  EEG has been done and is pending.    Review of Systems:  All systems reviewed, negative unless stated above .   Past Medical History  Diagnosis Date  . HYPERTENSION 01/15/2008  . SHOULDER, PAIN 01/15/2008  . TESTOSTERONE DEFICIENCY 05/21/2008  . Sleep apnea     uses a cpap   Past Surgical History  Procedure Laterality Date  . Appendectomy    . Hernia repair      lt ing hernia age 14  . Tonsillectomy    . Plantar fascia surgery  7/10    left heel  . Knee arthroscopy with patellar tendon repair Left 12/21/2012    Procedure: LEFT KNEE ARTHROSCOPY WITH MEDIAL and LATERALMENISECTOMY AND QUARDRICEP TENDON REPAIR;  Surgeon: Ninetta Lights, MD;  Location: [place];  Service: Orthopedics;  Laterality: Left;   Social History:  reports that he has never smoked. He has never used smokeless tobacco. He reports that he does not drink alcohol or use illicit drugs.  No Known Allergies  Family History  Problem Relation Age of Onset  . Colon cancer Neg Hx   . Stomach cancer Neg Hx      Prior to Admission medications   Medication Sig Start Date End Date Taking? Authorizing Provider  Chlorpheniramine-Phenylephrine (SINUS & ALLERGY PO) Take 1 tablet by mouth 2 (two) times daily as  needed (allergies).   Yes Historical Provider, MD  lisinopril-hydrochlorothiazide (PRINZIDE,ZESTORETIC) 20-12.5 MG per tablet take half tablet by mouth once daily 11/26/13  Yes Marletta Lor, MD  testosterone (ANDROGEL) 50 MG/5GM (1%) GEL apply 2 packets TO THE SKIN daily Patient taking differently: Place 5 g onto the skin daily. apply 2 packets TO THE SKIN daily 11/26/13  Yes Marletta Lor, MD   Physical Exam: Filed Vitals:   04/23/14 1030 04/23/14 1034 04/23/14 1045  BP: 128/69  138/85  Pulse: 62  69  Temp:  97.7 F (36.5 C)   Resp: 16  25  SpO2: 97%  95%    Wt Readings from Last 3 Encounters:  11/26/13 99.791 kg (220 lb)  02/19/13 100.699 kg (222 lb)  02/05/13 100.789 kg (222 lb 3.2 oz)    General:  Appears calm and comfortable Eyes: PERRL, normal lids, irises & conjunctiva ENT: grossly normal hearing, lips & tongue Neck: no LAD, masses or thyromegaly Cardiovascular: RRR, no m/r/g. No LE edema. Telemetry: SR, no arrhythmias  Respiratory: CTA bilaterally, no w/r/r. Normal respiratory effort. Abdomen: soft, ntnd Skin: no rash or induration seen on limited exam Musculoskeletal: grossly normal tone BUE/BLE Psychiatric: grossly normal mood and affect, speech fluent and appropriate Neurologic: grossly non-focal.          Labs on  Admission:  Basic Metabolic Panel:  Recent Labs Lab 04/23/14 1025 04/23/14 1033  NA 141 138  K 3.7 3.6*  CL 102 101  CO2 28  --   GLUCOSE 110* 116*  BUN 14 13  CREATININE 0.96 1.00  CALCIUM 9.1  --    Liver Function Tests:  Recent Labs Lab 04/23/14 1025  AST 27  ALT 26  ALKPHOS 80  BILITOT 0.3  PROT 7.7  ALBUMIN 3.6   No results for input(s): LIPASE, AMYLASE in the last 168 hours. No results for input(s): AMMONIA in the last 168 hours. CBC:  Recent Labs Lab 04/23/14 1025 04/23/14 1033  WBC 8.6  --   NEUTROABS 5.9  --   HGB 14.6 15.6  HCT 42.3 46.0  MCV 90.2  --   PLT 186  --    Cardiac Enzymes: No results  for input(s): CKTOTAL, CKMB, CKMBINDEX, TROPONINI in the last 168 hours.  BNP (last 3 results) No results for input(s): PROBNP in the last 8760 hours. CBG: No results for input(s): GLUCAP in the last 168 hours.  Radiological Exams on Admission: Ct Head Wo Contrast  04/23/2014   ADDENDUM REPORT: 04/23/2014 10:32  ADDENDUM: Critical Value/emergent results were called by telephone at the time of interpretation on 04/23/2014 at 1027am to Dr. Armida Sans, neurology , who verbally acknowledged these results.   Electronically Signed   By: Lowella Grip M.D.   On: 04/23/2014 10:32   04/23/2014   CLINICAL DATA:  Memory loss and altered mental status  EXAM: CT HEAD WITHOUT CONTRAST  TECHNIQUE: Contiguous axial images were obtained from the base of the skull through the vertex without intravenous contrast.  COMPARISON:  None.  FINDINGS: The ventricles are normal in size and configuration. There is no mass, hemorrhage, extra-axial fluid collection, or midline shift. Gray-white compartments are normal. No demonstrable acute infarct. Bony calvarium appears intact. The mastoid air cells are clear.  There is an air-fluid level in the left maxillary antrum. There is diffuse opacification of the right maxillary antrum. There is opacification of multiple ethmoid air cells bilaterally as well as the inferior right frontal sinus. There is diffuse nasal turbinate edema bilaterally with nares obstruction bilaterally.  IMPRESSION: Extensive sinusitis as well as bilateral nares obstruction due to nasal turbinate edema. Intracranially, there is no mass, hemorrhage, or acute appearing infarct.  Electronically Signed: By: Lowella Grip M.D. On: 04/23/2014 10:26    EKG: Independently reviewed. Sinus with PVC  Assessment/Plan Active Problems:   TIA (transient ischemic attack)   TIA -MRI -carotid -echo -FLP/HgbA1C -tele -EEG  OSA -CPap at night  HTN -allow permissive HTN for now   Neurology  consulted  Code Status: full DVT Prophylaxis: Family Communication: wife at bedside Disposition Plan:   Time spent: 85 min  [name] [facility] Pager [phone]

## 2014-04-23 NOTE — Progress Notes (Signed)
*  PRELIMINARY RESULTS* Vascular Ultrasound Carotid Duplex (Doppler) has been completed.   Study was technically limited due to patient anatomy. Findings suggest 1-39% carotid artery stenosis bilaterally. Vertebral arteries are patent with antegrade flow.  04/23/2014 6:43 PM Maudry Mayhew, RVT, RDCS, RDMS

## 2014-04-23 NOTE — ED Notes (Signed)
Pt transported to EEG 

## 2014-04-23 NOTE — ED Notes (Signed)
0700 noticed disoriented to days and time. Felt lightheaded; no weakness or slurred speech.

## 2014-04-23 NOTE — Telephone Encounter (Signed)
Patient Information:  Caller Name: [place]  Phone: [phone]  Patient: Cesar [name], Cesar [name]  Gender: Male  DOB: [birth year]  Age: 58 Years  PCP: Bluford Kaufmann (Family Practice > 2yrs old)  Office Follow Up:  Does the office need to follow up with this patient?: No  Instructions For The Office: N/A  RN Note:  EMS arrived to assess this pt. at the time of the call. Pt. advised to do as they recommended. Advised the pt. I will let his Dr. know.  Symptoms  Reason For Call & Symptoms: Pt is calling because he had a period of disorientation at the [facility] this morning. Did not know what day it was. Did not know why he was there. Someone told him he brought his daughter to swim practice. BP was 140/80s. Cannot remember exact BP. Pt. is at work now and the EMS has arrived to assess him.  Reviewed Health History In EMR: Yes  Reviewed Medications In EMR: Yes  Reviewed Allergies In EMR: Yes  Reviewed Surgeries / Procedures: Yes  Date of Onset of Symptoms: 04/23/2014  Guideline(s) Used:  No Protocol Available - Sick Adult  Disposition Per Guideline:   Home Care  Reason For Disposition Reached:   Patient's symptoms are safe to treat at home per nursing judgment  Advice Given:  Call Back If:  New symptoms develop  You become worse.  Patient Will Follow Care Advice:  YES

## 2014-04-23 NOTE — Consult Note (Signed)
Referring Physician: Tomi Bamberger    Chief Complaint: AMS  HPI:                                                                                                                                         Cesar [name] is an 58 y.o. male who woke up this AM at 0500 feeling fine.  He went to the gym at 0600 hours and worked out.  After working out he noted he was disoriented and could not tell people where he was and forgot he had brought his daughter to the gym. Due to prolonged period of confusion he was brought to [facility] ED.  He states had no weakness, blurred vision, diplopia, lack of vision.  Currently he is back to baseline.    He notes he gets light headedness once a week for the last year which is very brief. This episode was different due to disorientation.  He has had a work up by cardiology which he states was negative.  Denies HA, vertigo, double vision, difficulty swallowing, unsteadiness, focal weakness or numbness, slurred speech, language or vision impairment.   Date last known well: Date: 04/23/2014 Time last known well: Time: 05:00 tPA Given: No: symptoms resolved.  Past Medical History  Diagnosis Date  . HYPERTENSION 01/15/2008  . SHOULDER, PAIN 01/15/2008  . TESTOSTERONE DEFICIENCY 05/21/2008  . Sleep apnea     uses a cpap    Past Surgical History  Procedure Laterality Date  . Appendectomy    . Hernia repair      lt ing hernia age 42  . Tonsillectomy    . Plantar fascia surgery  7/10    left heel  . Knee arthroscopy with patellar tendon repair Left 12/21/2012    Procedure: LEFT KNEE ARTHROSCOPY WITH MEDIAL and LATERALMENISECTOMY AND QUARDRICEP TENDON REPAIR;  Surgeon: Ninetta Lights, MD;  Location: [place];  Service: Orthopedics;  Laterality: Left;    Family History  Problem Relation Age of Onset  . Colon cancer Neg Hx   . Stomach cancer Neg Hx    Social History:  reports that he has never smoked. He has never used smokeless tobacco. He reports that he  does not drink alcohol or use illicit drugs.  Allergies: No Known Allergies  Medications:  Current Facility-Administered Medications  Medication Dose Route Frequency Provider Last Rate Last Dose  . sodium chloride 0.9 % bolus 500 mL  500 mL Intravenous Once Marliss Coots, PA-C       Current Outpatient Prescriptions  Medication Sig Dispense Refill  . lisinopril-hydrochlorothiazide (PRINZIDE,ZESTORETIC) 20-12.5 MG per tablet take half tablet by mouth once daily 45 tablet 3  . testosterone (ANDROGEL) 50 MG/5GM (1%) GEL apply 2 packets TO THE SKIN daily 300 g 5     ROS:                                                                                                                                       History obtained from the patient  General ROS: negative for - chills, fatigue, fever, night sweats, weight gain or weight loss Psychological ROS: negative for - behavioral disorder, hallucinations, memory difficulties, mood swings or suicidal ideation Ophthalmic ROS: negative for - blurry vision, double vision, eye pain or loss of vision ENT ROS: negative for - epistaxis, nasal discharge, oral lesions, sore throat, tinnitus or vertigo Allergy and Immunology ROS: negative for - hives or itchy/watery eyes Hematological and Lymphatic ROS: negative for - bleeding problems, bruising or swollen lymph nodes Endocrine ROS: negative for - galactorrhea, hair pattern changes, polydipsia/polyuria or temperature intolerance Respiratory ROS: negative for - cough, hemoptysis, shortness of breath or wheezing Cardiovascular ROS: negative for - chest pain, dyspnea on exertion, edema or irregular heartbeat Gastrointestinal ROS: negative for - abdominal pain, diarrhea, hematemesis, nausea/vomiting or stool incontinence Genito-Urinary ROS: negative for - dysuria, hematuria, incontinence or  urinary frequency/urgency Musculoskeletal ROS: negative for - joint swelling or muscular weakness Neurological ROS: as noted in HPI Dermatological ROS: negative for rash and skin lesion changes  Neurologic Examination:                                                                                                      There were no vitals taken for this visit.   Physical Exam  Constitutional: He appears well-developed and well-nourished.  Psych: Affect appropriate to situation Eyes: No scleral injection HENT: No OP obstrucion Head: Normocephalic.  Cardiovascular: Normal rate and regular rhythm.  Respiratory: Effort normal and breath sounds normal.  GI: Soft. Bowel sounds are normal. No distension. There is no tenderness.  Skin: WDI  Neuro Exam: Mental Status: Alert, oriented, thought content appropriate.  Speech fluent without evidence of aphasia.  Able to follow 3 step commands without difficulty. Cranial Nerves: II: Discs  flat bilaterally; Visual fields grossly normal, pupils equal, round, reactive to light and accommodation III,IV, VI: ptosis not present, extra-ocular motions intact bilaterally V,VII: smile symmetric, facial light touch sensation normal bilaterally VIII: hearing normal bilaterally IX,X: gag reflex present XI: bilateral shoulder shrug XII: midline tongue extension without atrophy or fasciculations  Motor: Right : Upper extremity   5/5    Left:     Upper extremity   5/5  Lower extremity   5/5     Lower extremity   5/5 Tone and bulk:normal tone throughout; no atrophy noted Sensory: Pinprick and light touch intact throughout, bilaterally Deep Tendon Reflexes:  Right: Upper Extremity   Left: Upper extremity   biceps (C-5 to C-6) 2/4   biceps (C-5 to C-6) 2/4 tricep (C7) 2/4    triceps (C7) 2/4 Brachioradialis (C6) 2/4  Brachioradialis (C6) 2/4  Lower Extremity Lower Extremity  quadriceps (L-2 to L-4) 2/4   quadriceps (L-2 to L-4) 2/4 Achilles (S1)  2/4   Achilles (S1) 2/4  Plantars: Right: downgoing   Left: downgoing Cerebellar: normal finger-to-nose,  normal heel-to-shin test Gait: not tested CV: pulses palpable throughout    Lab Results: Basic Metabolic Panel: No results for input(s): NA, K, CL, CO2, GLUCOSE, BUN, CREATININE, CALCIUM, MG, PHOS in the last 168 hours.  Liver Function Tests: No results for input(s): AST, ALT, ALKPHOS, BILITOT, PROT, ALBUMIN in the last 168 hours. No results for input(s): LIPASE, AMYLASE in the last 168 hours. No results for input(s): AMMONIA in the last 168 hours.  CBC: No results for input(s): WBC, NEUTROABS, HGB, HCT, MCV, PLT in the last 168 hours.  Cardiac Enzymes: No results for input(s): CKTOTAL, CKMB, CKMBINDEX, TROPONINI in the last 168 hours.  Lipid Panel: No results for input(s): CHOL, TRIG, HDL, CHOLHDL, VLDL, LDLCALC in the last 168 hours.  CBG: No results for input(s): GLUCAP in the last 168 hours.  Microbiology: No results found for this or any previous visit.  Coagulation Studies: No results for input(s): LABPROT, INR in the last 72 hours.  Imaging: No results found.     Assessment and plan discussed with with attending physician and they are in agreement.    Etta Quill PA-C [facility] [phone]  04/23/2014, 10:29 AM   Assessment: 58 y.o. male presenting with transient episode of confusion. Etiology is unclear however given multiple episodes in the past year patient would benefit from work up to evaluate for TIA versus focal seizure with impairment of consciousness.   Stroke Risk Factors - hypertension  1. HgbA1c, fasting lipid panel 2. MRI, MRA  of the brain without contrast 3. PT consult, OT consult, Speech consult 4. Echocardiogram 5. Carotid dopplers 6. Prophylactic therapy-Antiplatelet med: Aspirin - dose 81 mg daily 7. Risk factor modification 8. Telemetry monitoring 9. Frequent neuro checks 10. EEG   Patient seen and  examined together with physician assistant and I concur with the assessment and plan.  Dorian Pod, MD

## 2014-04-23 NOTE — ED Provider Notes (Signed)
CSN: [id number]     Arrival date & time 04/23/14  1062 History   First MD Initiated Contact with Patient 04/23/14 [phone]     Chief Complaint  Patient presents with  . Code Stroke   HPI Patient presented to the emergency room with complaints of an acute onset of confusion and disorientation.  Patient woke up this morning and he felt well. He had finished working out at 7 AM when he noticed that he suddenly could not remember where he was. He did not know the time as well. He felt lightheaded and dizzy. He did not experience any focal weakness. No trouble with speech. He does not have any weakness or numbness now and his symptoms seemed to be improving. He denies any headache. Past Medical History  Diagnosis Date  . HYPERTENSION 01/15/2008  . SHOULDER, PAIN 01/15/2008  . TESTOSTERONE DEFICIENCY 05/21/2008  . Sleep apnea     uses a cpap   Past Surgical History  Procedure Laterality Date  . Appendectomy    . Hernia repair      lt ing hernia age 41  . Tonsillectomy    . Plantar fascia surgery  7/10    left heel  . Knee arthroscopy with patellar tendon repair Left 12/21/2012    Procedure: LEFT KNEE ARTHROSCOPY WITH MEDIAL and LATERALMENISECTOMY AND QUARDRICEP TENDON REPAIR;  Surgeon: Ninetta Lights, MD;  Location: [place];  Service: Orthopedics;  Laterality: Left;   Family History  Problem Relation Age of Onset  . Colon cancer Neg Hx   . Stomach cancer Neg Hx    History  Substance Use Topics  . Smoking status: Never Smoker   . Smokeless tobacco: Never Used  . Alcohol Use: No    Review of Systems  All other systems reviewed and are negative.     Allergies  Review of patient's allergies indicates no known allergies.  Home Medications   Prior to Admission medications   Medication Sig Start Date End Date Taking? Authorizing Provider  lisinopril-hydrochlorothiazide (PRINZIDE,ZESTORETIC) 20-12.5 MG per tablet take half tablet by mouth once daily 11/26/13   Marletta Lor, MD  testosterone (ANDROGEL) 50 MG/5GM (1%) GEL apply 2 packets TO THE SKIN daily 11/26/13   Marletta Lor, MD   There were no vitals taken for this visit. Physical Exam  Constitutional: He is oriented to person, place, and time. He appears well-developed and well-nourished. No distress.  HENT:  Head: Normocephalic and atraumatic.  Right Ear: External ear normal.  Left Ear: External ear normal.  Mouth/Throat: Oropharynx is clear and moist.  Eyes: Conjunctivae are normal. Right eye exhibits no discharge. Left eye exhibits no discharge. No scleral icterus.  Neck: Neck supple. No tracheal deviation present.  Cardiovascular: Normal rate, regular rhythm and intact distal pulses.   Pulmonary/Chest: Effort normal and breath sounds normal. No stridor. No respiratory distress. He has no wheezes. He has no rales.  Abdominal: Soft. Bowel sounds are normal. He exhibits no distension. There is no tenderness. There is no rebound and no guarding.  Musculoskeletal: He exhibits no edema or tenderness.  Neurological: He is alert and oriented to person, place, and time. He has normal strength. No cranial nerve deficit (no facial droop, extraocular movements intact, no slurred speech) or sensory deficit. He exhibits normal muscle tone. He displays no seizure activity. Coordination normal.  No pronator drift bilateral upper extrem, able to hold both legs off bed for 5 seconds, sensation intact in all extremities, no  visual field cuts, no left or right sided neglect, normal finger-nose exam bilaterally, no nystagmus noted   Skin: Skin is warm and dry. No rash noted.  Psychiatric: He has a normal mood and affect.  Nursing note and vitals reviewed.   ED Course  Procedures (including critical care time) Labs Review Labs Reviewed  COMPREHENSIVE METABOLIC PANEL - Abnormal; Notable for the following:    Glucose, Bld 110 (*)    GFR calc non Af Amer 89 (*)    All other components within normal  limits  I-STAT CHEM 8, ED - Abnormal; Notable for the following:    Potassium 3.6 (*)    Glucose, Bld 116 (*)    All other components within normal limits  ETHANOL  PROTIME-INR  APTT  CBC  DIFFERENTIAL  URINE RAPID DRUG SCREEN (HOSP PERFORMED)  URINALYSIS, ROUTINE W REFLEX MICROSCOPIC  I-STAT TROPOININ, ED  I-STAT TROPOININ, ED    Imaging Review Ct Head Wo Contrast  04/23/2014   ADDENDUM REPORT: 04/23/2014 10:32  ADDENDUM: Critical Value/emergent results were called by telephone at the time of interpretation on 04/23/2014 at 1027am to Dr. Armida Sans, neurology , who verbally acknowledged these results.   Electronically Signed   By: Lowella Grip M.D.   On: 04/23/2014 10:32   04/23/2014   CLINICAL DATA:  Memory loss and altered mental status  EXAM: CT HEAD WITHOUT CONTRAST  TECHNIQUE: Contiguous axial images were obtained from the base of the skull through the vertex without intravenous contrast.  COMPARISON:  None.  FINDINGS: The ventricles are normal in size and configuration. There is no mass, hemorrhage, extra-axial fluid collection, or midline shift. Gray-white compartments are normal. No demonstrable acute infarct. Bony calvarium appears intact. The mastoid air cells are clear.  There is an air-fluid level in the left maxillary antrum. There is diffuse opacification of the right maxillary antrum. There is opacification of multiple ethmoid air cells bilaterally as well as the inferior right frontal sinus. There is diffuse nasal turbinate edema bilaterally with nares obstruction bilaterally.  IMPRESSION: Extensive sinusitis as well as bilateral nares obstruction due to nasal turbinate edema. Intracranially, there is no mass, hemorrhage, or acute appearing infarct.  Electronically Signed: By: Lowella Grip M.D. On: 04/23/2014 10:26     EKG Interpretation   Date/Time:  Tuesday April 23 2014 09:56:54 EST Ventricular Rate:  77 PR Interval:  148 QRS Duration: 84 QT Interval:   404 QTC Calculation: 457 R Axis:   21 Text Interpretation:  Sinus rhythm with occasional Premature ventricular  complexes Otherwise normal ECG pvc new since last tracing  Otherwise no  significant change Confirmed by Agnes Probert  MD-J, Jonnelle Lawniczak ([id number]) on 04/23/2014  10:07:53 AM      MDM   Final diagnoses:  Transient cerebral ischemia, unspecified transient cerebral ischemia type    Pt has no focal deficits at this time.  Symptoms concerning for TIA.  Seen by neurology in the ED.  Admission for TIA evaluation recommended.   Dorie Rank, MD 04/23/14 [phone]

## 2014-04-23 NOTE — Progress Notes (Signed)
Patient has a home CPAP unit that he has brought to use while in the hospital. I helped set the machine up at bedside with fresh water, ready to use when needed. Patient states he can use the machine when needed without any help. Patient is aware to call RT if he does need any further assistance with the machine. The machine looks to be in good condition, with no frayed wires etc. RT will continue to assist as needed.

## 2014-04-23 NOTE — ED Notes (Addendum)
Patient woke up feeling "normal" at 05:00am.  Patient went to the gym at 0600, states he was normal (at the gym from 06:00 - 07:30).  At 07:00 patient states he felt very disoriented for approximately 10 minutes, not aware if  "he took a shower or how/why he got to the [facility], daughter was swimming this am".  Patient was able to drive self home from the [facility] after this episode.  Patient showing no deficits in the ED, negative stroke scale.

## 2014-04-23 NOTE — Progress Notes (Signed)
EEG Completed; Results Pending  

## 2014-04-23 NOTE — Telephone Encounter (Signed)
Noted  

## 2014-04-23 NOTE — Progress Notes (Signed)
  Echocardiogram 2D Echocardiogram has been performed.  Darlina Sicilian M 04/23/2014, 4:31 PM

## 2014-04-24 DIAGNOSIS — G458 Other transient cerebral ischemic attacks and related syndromes: Secondary | ICD-10-CM

## 2014-04-24 DIAGNOSIS — G451 Carotid artery syndrome (hemispheric): Secondary | ICD-10-CM

## 2014-04-24 LAB — LIPID PANEL
CHOL/HDL RATIO: 4.7 ratio
Cholesterol: 183 mg/dL (ref 0–200)
HDL: 39 mg/dL — ABNORMAL LOW (ref >39)
LDL CALC: 128 mg/dL — AB (ref 0–99)
Triglycerides: 79 mg/dL (ref <150)
VLDL: 16 mg/dL (ref 0–40)

## 2014-04-24 LAB — GLUCOSE, CAPILLARY
Glucose-Capillary: 193 mg/dL — ABNORMAL HIGH (ref 70–99)
Glucose-Capillary: 84 mg/dL (ref 70–99)

## 2014-04-24 MED ORDER — ATORVASTATIN CALCIUM 20 MG PO TABS: 20.0000 mg | ORAL_TABLET | Freq: Every day | ORAL | Status: DC

## 2014-04-24 MED ORDER — ASPIRIN 81 MG PO TBEC: 81.0000 mg | DELAYED_RELEASE_TABLET | Freq: Every day | ORAL | Status: AC

## 2014-04-24 MED ORDER — ATORVASTATIN CALCIUM 10 MG PO TABS
20.0000 mg | ORAL_TABLET | Freq: Every day | ORAL | Status: DC
Start: 2014-04-24 — End: 2014-04-24

## 2014-04-24 NOTE — Progress Notes (Signed)
Patient is discharged from room 4N25 at this time. Alert and in stable condition. IV site d/c's as well as tele. Instructions read to patient and understanding verbalized. Left unit via wheelchair with family and belongings at side.

## 2014-04-24 NOTE — Plan of Care (Signed)
Problem: Acute Treatment Outcomes Goal: Neuro exam at baseline or improved Outcome: Completed/Met Date Met:  04/24/14 Goal: Airway maintained/protected Outcome: Completed/Met Date Met:  04/24/14 Goal: 02 Sats > 94% Outcome: Completed/Met Date Met:  04/24/14 Goal: Hemodynamically stable Outcome: Completed/Met Date Met:  04/24/14 Goal: Prognosis discussed with family/patient as appropriate Outcome: Completed/Met Date Met:  04/24/14  Problem: Progression Outcomes Goal: Communication method established Outcome: Completed/Met Date Met:  04/24/14 Goal: Tolerating diet/TF at goal rate Outcome: Completed/Met Date Met:  04/24/14 Goal: Pain controlled Outcome: Completed/Met Date Met:  04/24/14

## 2014-04-24 NOTE — Progress Notes (Signed)
NEURO HOSPITALIST PROGRESS NOTE   SUBJECTIVE:                                                                                                                        Mr. Boney stated that he is doing great. MRI/MRA brain, TTE, CUS, and EEG are unremarkable. Cholesterol 183, triglycerides 79, HDL 39, LDL 128 On aspirin 81 mg daily.  OBJECTIVE:                                                                                                                           Vital signs in last 24 hours: Temp:  [97.6 F (36.4 C)-98.8 F (37.1 C)] 98.1 F (36.7 C) (11/25 0937) Pulse Rate:  [62-87] 87 (11/25 0937) Resp:  [16-25] 20 (11/25 0937) BP: (108-140)/(61-85) 134/83 mmHg (11/25 0937) SpO2:  [94 %-100 %] 99 % (11/25 0937)  Intake/Output from previous day: 11/24 0701 - 11/25 0700 In: 500 [IV Piggyback:500] Out: -  Intake/Output this shift: Total I/O In: 360 [P.O.:360] Out: -  Nutritional status: Diet Heart  Past Medical History  Diagnosis Date  . HYPERTENSION 01/15/2008  . SHOULDER, PAIN 01/15/2008  . TESTOSTERONE DEFICIENCY 05/21/2008  . OSA on CPAP   . Sinus headache     "take OTC medications prn"  . TIA (transient ischemic attack) 04/23/2014    "that's what they think I've had" (04/23/2014)  . Arthritis     "shoulders" (04/23/2014)  . Bursitis of shoulder   . Tendonitis of shoulder     "both"  Physical Exam  Constitutional: He appears well-developed and well-nourished.  Psych: Affect appropriate to situation Eyes: No scleral injection HENT: No OP obstrucion Head: Normocephalic.  Cardiovascular: Normal rate and regular rhythm.  Respiratory: Effort normal and breath sounds normal.  GI: Soft. Bowel sounds are normal. No distension. There is no tenderness.  Skin: WDI   Neurologic Exam:  Neuro Exam: Mental Status: Alert, oriented, thought content appropriate. Speech fluent without evidence of aphasia. Able to follow 3 step  commands without difficulty. Cranial Nerves: II: Discs flat bilaterally; Visual fields grossly normal, pupils equal, round, reactive to light and accommodation III,IV, VI: ptosis not present, extra-ocular motions intact bilaterally V,VII: smile symmetric, facial light touch sensation normal bilaterally  VIII: hearing normal bilaterally IX,X: gag reflex present XI: bilateral shoulder shrug XII: midline tongue extension without atrophy or fasciculations  Motor: Right :Upper extremity 5/5Left: Upper extremity 5/5 Lower extremity 5/5Lower extremity 5/5 Tone and bulk:normal tone throughout; no atrophy noted Sensory: Pinprick and light touch intact throughout, bilaterally Deep Tendon Reflexes:  Right: Upper Extremity Left: Upper extremity   biceps (C-5 to C-6) 2/4 biceps (C-5 to C-6) 2/4 tricep (C7) 2/4triceps (C7) 2/4 Brachioradialis (C6) 2/4Brachioradialis (C6) 2/4  Lower Extremity Lower Extremity  quadriceps (L-2 to L-4) 2/4 quadriceps (L-2 to L-4) 2/4 Achilles (S1) 2/4Achilles (S1) 2/4  Plantars: Right: downgoingLeft: downgoing Cerebellar: normal finger-to-nose, normal heel-to-shin test Gait: not tested CV: pulses palpable throughout   Lab Results: Lab Results  Component Value Date/Time   CHOL 183 04/24/2014 06:51 AM   Lipid Panel  Recent Labs  04/24/14 0651  CHOL 183  TRIG 79  HDL 39*  CHOLHDL 4.7  VLDL 16  LDLCALC 128*    Studies/Results: Ct Head Wo Contrast  04/23/2014   ADDENDUM REPORT: 04/23/2014 10:32  ADDENDUM: Critical Value/emergent results were called by telephone at the time of interpretation on 04/23/2014 at 1027am to Dr. Armida Sans, neurology , who verbally  acknowledged these results.   Electronically Signed   By: Lowella Grip M.D.   On: 04/23/2014 10:32   04/23/2014   CLINICAL DATA:  Memory loss and altered mental status  EXAM: CT HEAD WITHOUT CONTRAST  TECHNIQUE: Contiguous axial images were obtained from the base of the skull through the vertex without intravenous contrast.  COMPARISON:  None.  FINDINGS: The ventricles are normal in size and configuration. There is no mass, hemorrhage, extra-axial fluid collection, or midline shift. Gray-white compartments are normal. No demonstrable acute infarct. Bony calvarium appears intact. The mastoid air cells are clear.  There is an air-fluid level in the left maxillary antrum. There is diffuse opacification of the right maxillary antrum. There is opacification of multiple ethmoid air cells bilaterally as well as the inferior right frontal sinus. There is diffuse nasal turbinate edema bilaterally with nares obstruction bilaterally.  IMPRESSION: Extensive sinusitis as well as bilateral nares obstruction due to nasal turbinate edema. Intracranially, there is no mass, hemorrhage, or acute appearing infarct.  Electronically Signed: By: Lowella Grip M.D. On: 04/23/2014 10:26   Mri Brain Without Contrast  04/23/2014   CLINICAL DATA:  TIA  EXAM: MRI HEAD WITHOUT CONTRAST  MRA HEAD WITHOUT CONTRAST  TECHNIQUE: Multiplanar, multiecho pulse sequences of the brain and surrounding structures were obtained without intravenous contrast. Angiographic images of the head were obtained using MRA technique without contrast.  COMPARISON:  CT head 04/23/2014  FINDINGS: MRI HEAD FINDINGS  Ventricle size is normal.  Cerebral volume is normal.  Negative for acute infarct. Small hyperintensities in the high left frontal white matter likely due to chronic ischemia. No cortical infarct. Brainstem and cerebellum are normal.  Negative for hemorrhage. Negative for mass lesion. Pituitary is normal in size. Craniocervical junction is  normal.  Extensive mucosal edema throughout the paranasal sinuses  MRA HEAD FINDINGS  The left vertebral is dominant. Both vertebral arteries are patent to the basilar. PICA patent bilaterally. Basilar widely patent. Superior cerebellar and posterior cerebral arteries are widely patent  Cavernous carotid is widely patent bilaterally. Anterior and middle cerebral arteries are widely patent without stenosis  Negative for aneurysm  IMPRESSION: No acute intracranial abnormality. Minimal changes in the left frontal white matter likely related to chronic microvascular ischemia.  Negative MRA head  Extensive chronic sinusitis.   Electronically Signed   By: Franchot Gallo M.D.   On: 04/23/2014 15:21   Mr [name] Head/brain Wo Cm  04/23/2014   CLINICAL DATA:  TIA  EXAM: MRI HEAD WITHOUT CONTRAST  MRA HEAD WITHOUT CONTRAST  TECHNIQUE: Multiplanar, multiecho pulse sequences of the brain and surrounding structures were obtained without intravenous contrast. Angiographic images of the head were obtained using MRA technique without contrast.  COMPARISON:  CT head 04/23/2014  FINDINGS: MRI HEAD FINDINGS  Ventricle size is normal.  Cerebral volume is normal.  Negative for acute infarct. Small hyperintensities in the high left frontal white matter likely due to chronic ischemia. No cortical infarct. Brainstem and cerebellum are normal.  Negative for hemorrhage. Negative for mass lesion. Pituitary is normal in size. Craniocervical junction is normal.  Extensive mucosal edema throughout the paranasal sinuses  MRA HEAD FINDINGS  The left vertebral is dominant. Both vertebral arteries are patent to the basilar. PICA patent bilaterally. Basilar widely patent. Superior cerebellar and posterior cerebral arteries are widely patent  Cavernous carotid is widely patent bilaterally. Anterior and middle cerebral arteries are widely patent without stenosis  Negative for aneurysm  IMPRESSION: No acute intracranial abnormality. Minimal changes in  the left frontal white matter likely related to chronic microvascular ischemia.  Negative MRA head  Extensive chronic sinusitis.   Electronically Signed   By: Franchot Gallo M.D.   On: 04/23/2014 15:21    MEDICATIONS                                                                                                                        Scheduled: . aspirin EC  81 mg Oral Daily  . atorvastatin  20 mg Oral q1800  . enoxaparin (LOVENOX) injection  40 mg Subcutaneous Q24H    ASSESSMENT/PLAN:                                                                                                           58 y.o. male presenting with transient episode of confusion. ? TIA. Neurological work up unrevealing. Agree with aspirin 81 mg daily. Patient can be discharge home with outpatient neurology follow up in 3-4 weeks.  Dorian Pod, MD [facility] [phone]  04/24/2014, 9:43 AM

## 2014-04-24 NOTE — Progress Notes (Signed)
UR completed 

## 2014-04-24 NOTE — Discharge Summary (Signed)
PATIENT DETAILS Name: Cesar [name] Age: 58 y.o. Sex: male Date of Birth: Apr 05, 1956 MRN: [id number]. Admitting Physician: Geradine Girt, DO PJK:[id number],DXIPJ Pilar Plate, MD  Admit Date: 04/23/2014 Discharge date: 04/24/2014  Recommendations for Outpatient Follow-up:  1. Please refer to neurology for further monitoring/workup if symptoms recur 2. Please repeat lipid panel in 3 months.  PRIMARY DISCHARGE DIAGNOSIS:  Active Problems:   TIA (transient ischemic attack)      PAST MEDICAL HISTORY: Past Medical History  Diagnosis Date  . HYPERTENSION 01/15/2008  . SHOULDER, PAIN 01/15/2008  . TESTOSTERONE DEFICIENCY 05/21/2008  . OSA on CPAP   . Sinus headache     "take OTC medications prn"  . TIA (transient ischemic attack) 04/23/2014    "that's what they think I've had" (04/23/2014)  . Arthritis     "shoulders" (04/23/2014)  . Bursitis of shoulder   . Tendonitis of shoulder     "both"    DISCHARGE MEDICATIONS: Current Discharge Medication List    START taking these medications   Details  aspirin EC 81 MG EC tablet Take 1 tablet (81 mg total) by mouth daily.    atorvastatin (LIPITOR) 20 MG tablet Take 1 tablet (20 mg total) by mouth daily at 6 PM. Qty: 30 tablet, Refills: 0      CONTINUE these medications which have NOT CHANGED   Details  Chlorpheniramine-Phenylephrine (SINUS & ALLERGY PO) Take 1 tablet by mouth 2 (two) times daily as needed (allergies).    lisinopril-hydrochlorothiazide (PRINZIDE,ZESTORETIC) 20-12.5 MG per tablet take half tablet by mouth once daily Qty: 45 tablet, Refills: 3    testosterone (ANDROGEL) 50 MG/5GM (1%) GEL apply 2 packets TO THE SKIN daily Qty: 300 g, Refills: 5        ALLERGIES:  No Known Allergies  BRIEF HPI:  See H&P, Labs, Consult and Test reports for all details in brief, patient was admitted for a brief period of confusion and lightheadedness.  CONSULTATIONS:   neurology  PERTINENT RADIOLOGIC STUDIES: Ct Head Wo  Contrast  04/23/2014   ADDENDUM REPORT: 04/23/2014 10:32  ADDENDUM: Critical Value/emergent results were called by telephone at the time of interpretation on 04/23/2014 at 1027am to Dr. Armida Sans, neurology , who verbally acknowledged these results.   Electronically Signed   By: Lowella Grip M.D.   On: 04/23/2014 10:32   04/23/2014   CLINICAL DATA:  Memory loss and altered mental status  EXAM: CT HEAD WITHOUT CONTRAST  TECHNIQUE: Contiguous axial images were obtained from the base of the skull through the vertex without intravenous contrast.  COMPARISON:  None.  FINDINGS: The ventricles are normal in size and configuration. There is no mass, hemorrhage, extra-axial fluid collection, or midline shift. Gray-white compartments are normal. No demonstrable acute infarct. Bony calvarium appears intact. The mastoid air cells are clear.  There is an air-fluid level in the left maxillary antrum. There is diffuse opacification of the right maxillary antrum. There is opacification of multiple ethmoid air cells bilaterally as well as the inferior right frontal sinus. There is diffuse nasal turbinate edema bilaterally with nares obstruction bilaterally.  IMPRESSION: Extensive sinusitis as well as bilateral nares obstruction due to nasal turbinate edema. Intracranially, there is no mass, hemorrhage, or acute appearing infarct.  Electronically Signed: By: Lowella Grip M.D. On: 04/23/2014 10:26   Mri Brain Without Contrast  04/23/2014   CLINICAL DATA:  TIA  EXAM: MRI HEAD WITHOUT CONTRAST  MRA HEAD WITHOUT CONTRAST  TECHNIQUE: Multiplanar, multiecho pulse sequences of the brain  and surrounding structures were obtained without intravenous contrast. Angiographic images of the head were obtained using MRA technique without contrast.  COMPARISON:  CT head 04/23/2014  FINDINGS: MRI HEAD FINDINGS  Ventricle size is normal.  Cerebral volume is normal.  Negative for acute infarct. Small hyperintensities in the high left  frontal white matter likely due to chronic ischemia. No cortical infarct. Brainstem and cerebellum are normal.  Negative for hemorrhage. Negative for mass lesion. Pituitary is normal in size. Craniocervical junction is normal.  Extensive mucosal edema throughout the paranasal sinuses  MRA HEAD FINDINGS  The left vertebral is dominant. Both vertebral arteries are patent to the basilar. PICA patent bilaterally. Basilar widely patent. Superior cerebellar and posterior cerebral arteries are widely patent  Cavernous carotid is widely patent bilaterally. Anterior and middle cerebral arteries are widely patent without stenosis  Negative for aneurysm  IMPRESSION: No acute intracranial abnormality. Minimal changes in the left frontal white matter likely related to chronic microvascular ischemia.  Negative MRA head  Extensive chronic sinusitis.   Electronically Signed   By: Franchot Gallo M.D.   On: 04/23/2014 15:21   Mr [name] Head/brain Wo Cm  04/23/2014   CLINICAL DATA:  TIA  EXAM: MRI HEAD WITHOUT CONTRAST  MRA HEAD WITHOUT CONTRAST  TECHNIQUE: Multiplanar, multiecho pulse sequences of the brain and surrounding structures were obtained without intravenous contrast. Angiographic images of the head were obtained using MRA technique without contrast.  COMPARISON:  CT head 04/23/2014  FINDINGS: MRI HEAD FINDINGS  Ventricle size is normal.  Cerebral volume is normal.  Negative for acute infarct. Small hyperintensities in the high left frontal white matter likely due to chronic ischemia. No cortical infarct. Brainstem and cerebellum are normal.  Negative for hemorrhage. Negative for mass lesion. Pituitary is normal in size. Craniocervical junction is normal.  Extensive mucosal edema throughout the paranasal sinuses  MRA HEAD FINDINGS  The left vertebral is dominant. Both vertebral arteries are patent to the basilar. PICA patent bilaterally. Basilar widely patent. Superior cerebellar and posterior cerebral arteries are widely  patent  Cavernous carotid is widely patent bilaterally. Anterior and middle cerebral arteries are widely patent without stenosis  Negative for aneurysm  IMPRESSION: No acute intracranial abnormality. Minimal changes in the left frontal white matter likely related to chronic microvascular ischemia.  Negative MRA head  Extensive chronic sinusitis.   Electronically Signed   By: Franchot Gallo M.D.   On: 04/23/2014 15:21     PERTINENT LAB RESULTS: CBC:  Recent Labs  04/23/14 1025 04/23/14 1033  WBC 8.6  --   HGB 14.6 15.6  HCT 42.3 46.0  PLT 186  --    CMET CMP     Component Value Date/Time   NA 138 04/23/2014 1033   K 3.6* 04/23/2014 1033   CL 101 04/23/2014 1033   CO2 28 04/23/2014 1025   GLUCOSE 116* 04/23/2014 1033   BUN 13 04/23/2014 1033   CREATININE 1.00 04/23/2014 1033   CALCIUM 9.1 04/23/2014 1025   PROT 7.7 04/23/2014 1025   ALBUMIN 3.6 04/23/2014 1025   AST 27 04/23/2014 1025   ALT 26 04/23/2014 1025   ALKPHOS 80 04/23/2014 1025   BILITOT 0.3 04/23/2014 1025   GFRNONAA 89* 04/23/2014 1025   GFRAA >90 04/23/2014 1025    GFR CrCl cannot be calculated (Unknown ideal weight.). No results for input(s): LIPASE, AMYLASE in the last 72 hours. No results for input(s): CKTOTAL, CKMB, CKMBINDEX, TROPONINI in the last 72 hours. Invalid input(s): POCBNP  No results for input(s): DDIMER in the last 72 hours.  Recent Labs  04/23/14 1525  HGBA1C 6.3*    Recent Labs  04/24/14 0651  CHOL 183  HDL 39*  LDLCALC 128*  TRIG 79  CHOLHDL 4.7   No results for input(s): TSH, T4TOTAL, T3FREE, THYROIDAB in the last 72 hours.  Invalid input(s): FREET3 No results for input(s): VITAMINB12, FOLATE, FERRITIN, TIBC, IRON, RETICCTPCT in the last 72 hours. Coags:  Recent Labs  04/23/14 1025  INR 1.05   Microbiology: No results found for this or any previous visit (from the past 240 hour(s)).   BRIEF HOSPITAL COURSE:   Active Problems:   Suspected TIA (transient ischemic  attack): Patient was admitted for a brief period of confusion/lightheadedness-approximately 10 minutes. He was admitted out of concern for possible TIA. He was evaluated by neurology, and underwent MRI/MRA brain which was negative for acute stroke. 2 D echocardiogram was negative for an embolic source. Preliminary carotid ultrasound was negative for any significant stenosis. EEG was unremarkable for any epileptiform discharges. LDL was not at goal and elevated at 128. A1c at 6.3. Current recommendations from neurology are to start aspirin, since LDL not at goal will start Lipitor. No further workup required per neurology. Will need outpatient neurology follow-up. I have asked patient to call [facility] neurology and make an appointment with an M.D. of his choice.  Rest of patient's medical issues were stable during this short inpatient stay.    TODAY-DAY OF DISCHARGE:  Subjective:   Demetrios Byron today has no headache,no chest abdominal pain,no new weakness tingling or numbness, feels much better wants to go home today.   Objective:   Blood pressure 134/83, pulse 87, temperature 98.1 F (36.7 C), temperature source Oral, resp. rate 20, SpO2 99 %.  Intake/Output Summary (Last 24 hours) at 04/24/14 1008 Last data filed at 04/24/14 0851  Gross per 24 hour  Intake    860 ml  Output      0 ml  Net    860 ml   There were no vitals filed for this visit.  Exam Awake Alert, Oriented *3, No new F.N deficits, Normal affect [place].AT,PERRAL Supple Neck,No JVD, No cervical lymphadenopathy appriciated.  Symmetrical Chest wall movement, Good air movement bilaterally, CTAB RRR,No Gallops,Rubs or new Murmurs, No Parasternal Heave +ve B.Sounds, Abd Soft, Non tender, No organomegaly appriciated, No rebound -guarding or rigidity. No Cyanosis, Clubbing or edema, No new Rash or bruise  DISCHARGE CONDITION: Stable  DISPOSITION: Home  DISCHARGE INSTRUCTIONS:    Activity:  As tolerated   Diet  recommendation: Heart Healthy diet  Discharge Instructions    Call MD for:  persistant dizziness or light-headedness    Complete by:  As directed      Diet - low sodium heart healthy    Complete by:  As directed      Increase activity slowly    Complete by:  As directed            Follow-up Information    Follow up with Nyoka Cowden, MD. Schedule an appointment as soon as possible for a visit in 1 week.   Specialty:  Internal Medicine   Contact information:   [place]  [id number] [phone]       Follow up with SETHI,PRAMOD, MD. Schedule an appointment as soon as possible for a visit in 1 month.   Specialties:  Neurology, Radiology   Contact information:   [address] [place]  [place] [address] [phone]         Total Time spent on discharge equals 45 minutes.  SignedOren Binet 04/24/2014 10:08 AM

## 2014-04-29 ENCOUNTER — Telehealth: Payer: Self-pay | Admitting: Internal Medicine

## 2014-04-29 DIAGNOSIS — I639 Cerebral infarction, unspecified: Secondary | ICD-10-CM

## 2014-04-29 DIAGNOSIS — G459 Transient cerebral ischemic attack, unspecified: Secondary | ICD-10-CM

## 2014-04-29 NOTE — Telephone Encounter (Signed)
Please schedule neurology referral

## 2014-04-29 NOTE — Telephone Encounter (Signed)
Please advise 

## 2014-04-29 NOTE — Telephone Encounter (Signed)
Pt was dc'd from hospital in wed. Pt diagnosed w/ mini stroke.  Pt advise to fu w/ pcp and to see neurologist Dr Leonie Man. But I do not see referral.  Pt saw dr Sloan Leiter in the hospital.  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  pls advise

## 2014-04-30 NOTE — Telephone Encounter (Signed)
Spoke to pt, told him okay to return to work and I sent order for referral to Neurologist and someone will contact for an appointment. Pt verbalized understanding.

## 2014-04-30 NOTE — Telephone Encounter (Signed)
Pt aware referral to neuro has been placed.   Pt states he is back at work.  Pt is a Pharmacist, hospital. The hospital  nurse called pt's home yesterday.  When wife told them he was back at work, wife states they seemed surprised.  But pt states he had no direction or instructions of what to do, or not to do. So pt returned to work on Monday. Pt states he is not doing any after school extra activities and generally taking it easy. Pt did not have a note to return to work, but the school has not asked for one either. Pt would like your advise on this matter, Pt states he will keep his phone on.

## 2014-04-30 NOTE — Telephone Encounter (Signed)
Mailbox full and unable to leave message.  Okay for patient to return to work; nonurgent follow-up primary care and neurology

## 2014-04-30 NOTE — Telephone Encounter (Signed)
Left message on voicemail to call office.  

## 2014-04-30 NOTE — Telephone Encounter (Signed)
Dr. Raliegh Ip, please see message and advise pt on work. Referral order sent.

## 2014-06-14 ENCOUNTER — Ambulatory Visit (INDEPENDENT_AMBULATORY_CARE_PROVIDER_SITE_OTHER): Payer: BC Managed Care – PPO | Admitting: Neurology

## 2014-06-14 ENCOUNTER — Encounter: Payer: Self-pay | Admitting: Neurology

## 2014-06-14 VITALS — BP 119/72 | HR 73 | Ht 69.0 in | Wt 217.8 lb

## 2014-06-14 DIAGNOSIS — G458 Other transient cerebral ischemic attacks and related syndromes: Secondary | ICD-10-CM

## 2014-06-14 NOTE — Patient Instructions (Signed)
Stroke Prevention Some medical conditions and behaviors are associated with an increased chance of having a stroke. You may prevent a stroke by making healthy choices and managing medical conditions. HOW CAN I REDUCE MY RISK OF HAVING A STROKE?   Stay physically active. Get at least 30 minutes of activity on most or all days.  Do not smoke. It may also be helpful to avoid exposure to secondhand smoke.  Limit alcohol use. Moderate alcohol use is considered to be:  No more than 2 drinks per day for men.  No more than 1 drink per day for nonpregnant women.  Eat healthy foods. This involves:  Eating 5 or more servings of fruits and vegetables a day.  Making dietary changes that address high blood pressure (hypertension), high cholesterol, diabetes, or obesity.  Manage your cholesterol levels.  Making food choices that are high in fiber and low in saturated fat, trans fat, and cholesterol may control cholesterol levels.  Take any prescribed medicines to control cholesterol as directed by your health care provider.  Manage your diabetes.  Controlling your carbohydrate and sugar intake is recommended to manage diabetes.  Take any prescribed medicines to control diabetes as directed by your health care provider.  Control your hypertension.  Making food choices that are low in salt (sodium), saturated fat, trans fat, and cholesterol is recommended to manage hypertension.  Take any prescribed medicines to control hypertension as directed by your health care provider.  Maintain a healthy weight.  Reducing calorie intake and making food choices that are low in sodium, saturated fat, trans fat, and cholesterol are recommended to manage weight.  Stop drug abuse.  Avoid taking birth control pills.  Talk to your health care provider about the risks of taking birth control pills if you are over 35 years old, smoke, get migraines, or have ever had a blood clot.  Get evaluated for sleep  disorders (sleep apnea).  Talk to your health care provider about getting a sleep evaluation if you snore a lot or have excessive sleepiness.  Take medicines only as directed by your health care provider.  For some people, aspirin or blood thinners (anticoagulants) are helpful in reducing the risk of forming abnormal blood clots that can lead to stroke. If you have the irregular heart rhythm of atrial fibrillation, you should be on a blood thinner unless there is a good reason you cannot take them.  Understand all your medicine instructions.  Make sure that other conditions (such as anemia or atherosclerosis) are addressed. SEEK IMMEDIATE MEDICAL CARE IF:   You have sudden weakness or numbness of the face, arm, or leg, especially on one side of the body.  Your face or eyelid droops to one side.  You have sudden confusion.  You have trouble speaking (aphasia) or understanding.  You have sudden trouble seeing in one or both eyes.  You have sudden trouble walking.  You have dizziness.  You have a loss of balance or coordination.  You have a sudden, severe headache with no known cause.  You have new chest pain or an irregular heartbeat. Any of these symptoms may represent a serious problem that is an emergency. Do not wait to see if the symptoms will go away. Get medical help at once. Call your local emergency services (911 in [place] Do not drive yourself to the hospital. Document Released: 06/24/2004 Document Revised: 10/01/2013 Document Reviewed: 11/17/2012 [employer] Patient Information 2015 [employer]. This information is not intended to replace advice given   to you by your health care provider. Make sure you discuss any questions you have with your health care provider.  

## 2014-06-14 NOTE — Progress Notes (Signed)
Reason for visit: Possible TIA  Cesar [name] is a 59 y.o. male  History of present illness:  Cesar [name] is a 59 year old right-handed black male with a history of hypertension and sleep apnea. He was admitted to the hospital around 04/23/2014 with an episode of confusion. The patient indicates that he was exercising at the gym, and he had a bout a 10 minute episode of amnesia. The patient does not recall taking a shower or getting dressed. He remembers being fully closed, sitting on the bench in the locker room, and feeling confused. The patient went to the hospital for evaluation. MRI of the brain, MRA of the head, 2-D echocardiogram, carotid Doppler studies, and an EEG study were done. The entire workup was unremarkable. The patient was placed on low-dose aspirin. A hemoglobin A1c was done, with a value of 6.3. The patient has not had any episodes of recurrence since that time. The patient denied any headache, slurred speech, numbness or weakness on the face, arms, or legs, balance problems, or visual disturbance with the event. He has had episodes of feeling lightheaded, with redness of the eyes that may occur off and on. He does not have confusion or amnesia with these events. The patient is sent to this office for further evaluation.  Past Medical History  Diagnosis Date  . HYPERTENSION 01/15/2008  . SHOULDER, PAIN 01/15/2008  . TESTOSTERONE DEFICIENCY 05/21/2008  . OSA on CPAP   . Sinus headache     "take OTC medications prn"  . TIA (transient ischemic attack) 04/23/2014    "that's what they think I've had" (04/23/2014)  . Arthritis     "shoulders" (04/23/2014)  . Bursitis of shoulder   . Tendonitis of shoulder     "both"    Past Surgical History  Procedure Laterality Date  . Plantar fascia surgery Left 7/10    heel  . Knee arthroscopy with patellar tendon repair Left 12/21/2012    Procedure: LEFT KNEE ARTHROSCOPY WITH MEDIAL and LATERALMENISECTOMY AND QUARDRICEP TENDON  REPAIR;  Surgeon: Ninetta Lights, MD;  Location: [facility];  Service: Orthopedics;  Laterality: Left;  . Inguinal hernia repair Left 1973  . Tonsillectomy  1960's  . Appendectomy  1978    Family History  Problem Relation Age of Onset  . Colon cancer Neg Hx   . Stomach cancer Neg Hx   . Hypertension Mother   . Stroke Father   . Hypertension Sister     Social history:  reports that he has never smoked. He has never used smokeless tobacco. He reports that he does not drink alcohol or use illicit drugs.  Medications:  Current Outpatient Prescriptions on File Prior to Visit  Medication Sig Dispense Refill  . aspirin EC 81 MG EC tablet Take 1 tablet (81 mg total) by mouth daily.    . Chlorpheniramine-Phenylephrine (SINUS & ALLERGY PO) Take 1 tablet by mouth 2 (two) times daily as needed (allergies).    Marland Kitchen lisinopril-hydrochlorothiazide (PRINZIDE,ZESTORETIC) 20-12.5 MG per tablet take half tablet by mouth once daily 45 tablet 3  . testosterone (ANDROGEL) 50 MG/5GM (1%) GEL apply 2 packets TO THE SKIN daily (Patient taking differently: Place 5 g onto the skin daily. apply 2 packets TO THE SKIN daily) 300 g 5   No current facility-administered medications on file prior to visit.     No Known Allergies  ROS:  Out of a complete 14 system review of symptoms, the patient complains only of  the following symptoms, and all other reviewed systems are negative.  Joint pain Allergies Numbness, left hand  Blood pressure 119/72, pulse 73, height 5\' 9"  (1.753 m), weight 217 lb 12.8 oz (98.793 kg).  Physical Exam  General: The patient is alert and cooperative at the time of the examination. The patient is moderately obese.  Eyes: Pupils are equal, round, and reactive to light. Discs are flat bilaterally.  Neck: The neck is supple, no carotid bruits are noted.  Respiratory: The respiratory examination is clear.  Cardiovascular: The cardiovascular examination reveals a  regular rate and rhythm, no obvious murmurs or rubs are noted.  Neuromuscular: The patient has incomplete abduction of the left arm with restriction of movement across the left shoulder.  Skin: Extremities are without significant edema.  Neurologic Exam  Mental status: The patient is alert and oriented x 3 at the time of the examination. The patient has apparent normal recent and remote memory, with an apparently normal attention span and concentration ability.  Cranial nerves: Facial symmetry is present. There is good sensation of the face to pinprick and soft touch bilaterally. The strength of the facial muscles and the muscles to head turning and shoulder shrug are normal bilaterally. Speech is well enunciated, no aphasia or dysarthria is noted. Extraocular movements are full. Visual fields are full. The tongue is midline, and the patient has symmetric elevation of the soft palate. No obvious hearing deficits are noted.  Motor: The motor testing reveals 5 over 5 strength of all 4 extremities. Good symmetric motor tone is noted throughout.  Sensory: Sensory testing is intact to pinprick, soft touch, vibration sensation, and position sense on all 4 extremities. No evidence of extinction is noted.  Coordination: Cerebellar testing reveals good finger-nose-finger and heel-to-shin bilaterally.  Gait and station: Gait is normal. Tandem gait is normal. Romberg is negative. No drift is seen.  Reflexes: Deep tendon reflexes are symmetric and normal bilaterally. Toes are downgoing bilaterally.   MRI brain/MRA head 04/23/14:  IMPRESSION: No acute intracranial abnormality. Minimal changes in the left frontal white matter likely related to chronic microvascular ischemia.  Negative MRA head  Extensive chronic sinusitis.   Carotid Doppler study 04/23/2014:  Summary: Findings suggest 1-39% internal carotid artery stenosis bilaterally. Vertebral arteries are patent with antegrade  flow.   2-D echocardiogram 04/23/2014:   Study Conclusions  - Left ventricle: The cavity size was normal. There was mild focal basal hypertrophy of the septum. Systolic function was normal. The estimated ejection fraction was in the range of 55% to 60%. Wall motion was normal; there were no regional wall motion abnormalities. - Mitral valve: Calcified annulus.  Impressions:  - No cardiac source of emboli was indentified.    Assessment/Plan:  1. Possible TIA  2. Prediabetes  The patient had an event that would be unusual for a TIA. The patient had amnesia without any focal symptoms. The episode of amnesia was quite brief, lasting about 10 minutes, and this would be atypical for transient global amnesia. The possibility of a transient hypoglycemic event does need be considered given the fact that the patient appears to have prediabetes. The patient had a thorough stroke workup, he is to remain on aspirin therapy. We will follow-up with this patient on an as-needed basis. He will contact me if any further events occur.  Jill Alexanders MD 06/14/2014 3:06 PM  [facility] [address] [place], [place] [address]  Phone [phone] Fax [phone]

## 2014-06-17 ENCOUNTER — Telehealth: Payer: Self-pay | Admitting: *Deleted

## 2014-06-17 DIAGNOSIS — Z0289 Encounter for other administrative examinations: Secondary | ICD-10-CM

## 2014-06-17 NOTE — Telephone Encounter (Signed)
Form on Lolita Cram.

## 2014-07-09 ENCOUNTER — Telehealth: Payer: Self-pay | Admitting: *Deleted

## 2014-07-09 NOTE — Telephone Encounter (Signed)
Form,[employer] received,completed by Nurse Butch Penny and Dr Jannifer Franklin 07-09-14.

## 2014-08-11 ENCOUNTER — Other Ambulatory Visit: Payer: Self-pay | Admitting: Internal Medicine

## 2014-08-20 ENCOUNTER — Other Ambulatory Visit: Payer: Self-pay | Admitting: *Deleted

## 2014-08-20 MED ORDER — TESTOSTERONE 20.25 MG/ACT (1.62%) TD GEL: 2.0000 "application " | Freq: Every day | TRANSDERMAL | Status: DC

## 2014-12-20 ENCOUNTER — Other Ambulatory Visit (INDEPENDENT_AMBULATORY_CARE_PROVIDER_SITE_OTHER): Payer: BC Managed Care – PPO

## 2014-12-20 DIAGNOSIS — Z0000 Encounter for general adult medical examination without abnormal findings: Secondary | ICD-10-CM | POA: Diagnosis not present

## 2014-12-20 DIAGNOSIS — E349 Endocrine disorder, unspecified: Secondary | ICD-10-CM

## 2014-12-20 DIAGNOSIS — E291 Testicular hypofunction: Secondary | ICD-10-CM

## 2014-12-20 LAB — POCT URINALYSIS DIPSTICK
BILIRUBIN UA: NEGATIVE
Glucose, UA: NEGATIVE
Ketones, UA: NEGATIVE
LEUKOCYTES UA: NEGATIVE
NITRITE UA: NEGATIVE
Protein, UA: NEGATIVE
SPEC GRAV UA: 1.02
UROBILINOGEN UA: 0.2
pH, UA: 6

## 2014-12-20 LAB — CBC WITH DIFFERENTIAL/PLATELET
Basophils Absolute: 0 10*3/uL (ref 0.0–0.1)
Basophils Relative: 0.5 % (ref 0.0–3.0)
Eosinophils Absolute: 0.3 10*3/uL (ref 0.0–0.7)
Eosinophils Relative: 3.3 % (ref 0.0–5.0)
HEMATOCRIT: 46.4 % (ref 39.0–52.0)
Hemoglobin: 15.5 g/dL (ref 13.0–17.0)
Lymphocytes Relative: 36.1 % (ref 12.0–46.0)
Lymphs Abs: 3 10*3/uL (ref 0.7–4.0)
MCHC: 33.5 g/dL (ref 30.0–36.0)
MCV: 94.3 fl (ref 78.0–100.0)
Monocytes Absolute: 0.8 10*3/uL (ref 0.1–1.0)
Monocytes Relative: 8.9 % (ref 3.0–12.0)
Neutro Abs: 4.3 10*3/uL (ref 1.4–7.7)
Neutrophils Relative %: 51.2 % (ref 43.0–77.0)
Platelets: 170 10*3/uL (ref 150.0–400.0)
RBC: 4.92 Mil/uL (ref 4.22–5.81)
RDW: 13.5 % (ref 11.5–15.5)
WBC: 8.4 10*3/uL (ref 4.0–10.5)

## 2014-12-20 LAB — PSA: PSA: 0.52 ng/mL (ref 0.10–4.00)

## 2014-12-20 LAB — LIPID PANEL
Cholesterol: 195 mg/dL (ref 0–200)
HDL: 41.1 mg/dL (ref >39.00)
LDL CALC: 140 mg/dL — AB (ref 0–99)
NonHDL: 153.9
TRIGLYCERIDES: 69 mg/dL (ref 0.0–149.0)
Total CHOL/HDL Ratio: 5
VLDL: 13.8 mg/dL (ref 0.0–40.0)

## 2014-12-20 LAB — BASIC METABOLIC PANEL
BUN: 14 mg/dL (ref 6–23)
CHLORIDE: 101 meq/L (ref 96–112)
CO2: 31 mEq/L (ref 19–32)
Calcium: 9.1 mg/dL (ref 8.4–10.5)
Creatinine, Ser: 0.89 mg/dL (ref 0.40–1.50)
GFR: 112.11 mL/min (ref >60.00)
GLUCOSE: 102 mg/dL — AB (ref 70–99)
POTASSIUM: 4.1 meq/L (ref 3.5–5.1)
SODIUM: 138 meq/L (ref 135–145)

## 2014-12-20 LAB — MICROALBUMIN / CREATININE URINE RATIO
Creatinine,U: 156.5 mg/dL
Microalb Creat Ratio: 0.4 mg/g (ref 0.0–30.0)

## 2014-12-20 LAB — HEPATIC FUNCTION PANEL
ALBUMIN: 4 g/dL (ref 3.5–5.2)
ALK PHOS: 74 U/L (ref 39–117)
ALT: 22 U/L (ref 0–53)
AST: 22 U/L (ref 0–37)
Bilirubin, Direct: 0.1 mg/dL (ref 0.0–0.3)
Total Bilirubin: 0.6 mg/dL (ref 0.2–1.2)
Total Protein: 7.1 g/dL (ref 6.0–8.3)

## 2014-12-20 LAB — TESTOSTERONE: Testosterone: 221.96 ng/dL — ABNORMAL LOW (ref 300.00–890.00)

## 2014-12-20 LAB — HEMOGLOBIN A1C: HEMOGLOBIN A1C: 5.6 % (ref 4.6–6.5)

## 2014-12-20 LAB — TSH: TSH: 1.19 u[IU]/mL (ref 0.35–4.50)

## 2014-12-31 ENCOUNTER — Encounter: Payer: Self-pay | Admitting: Internal Medicine

## 2014-12-31 ENCOUNTER — Ambulatory Visit (INDEPENDENT_AMBULATORY_CARE_PROVIDER_SITE_OTHER): Payer: BC Managed Care – PPO | Admitting: Internal Medicine

## 2014-12-31 VITALS — BP 130/80 | HR 66 | Temp 98.5°F | Resp 20 | Ht 68.0 in | Wt 215.0 lb

## 2014-12-31 DIAGNOSIS — I10 Essential (primary) hypertension: Secondary | ICD-10-CM

## 2014-12-31 DIAGNOSIS — Z0000 Encounter for general adult medical examination without abnormal findings: Secondary | ICD-10-CM | POA: Diagnosis not present

## 2014-12-31 DIAGNOSIS — G4730 Sleep apnea, unspecified: Secondary | ICD-10-CM

## 2014-12-31 DIAGNOSIS — E785 Hyperlipidemia, unspecified: Secondary | ICD-10-CM

## 2014-12-31 MED ORDER — TESTOSTERONE 20.25 MG/ACT (1.62%) TD GEL: 3.0000 "application " | Freq: Every day | TRANSDERMAL | Status: DC

## 2014-12-31 NOTE — Progress Notes (Signed)
Pre visit review using our clinic review tool, if applicable. No additional management support is needed unless otherwise documented below in the visit note. 

## 2014-12-31 NOTE — Patient Instructions (Signed)
Limit your sodium (Salt) intake    It is important that you exercise regularly, at least 20 minutes 3 to 4 times per week.  If you develop chest pain or shortness of breath seek  medical attention.  You need to lose weight.  Consider a lower calorie diet and regular exercise.  Return in one year for follow-up 

## 2014-12-31 NOTE — Progress Notes (Signed)
Subjective:    Patient ID: Cesar [name], male    DOB: [birth year], 59 y.o.   MRN: [id number]  HPI   Subjective:    Patient ID: Cesar [name], male    DOB: [birth year], 59 y.o.   MRN: [id number]  HPI 59 -year-old patient who is seen today for a health maintenance examination.  Medical problems include treated hypertension. He also has a history of testosterone deficiency and OS A. He has done quite well. Approximately one year ago.  He underwent surgery of the left knee. In November 2015 underwent a complete neurological evaluation due to transient amnesia without focal symptoms He continues to have episodes where he becomes quite dizzy and lightheaded.  He states this is associated with conjunctival redness and resolves in a few minutes.  The symptoms improved after cutting his antihypertensive medication.  He also feels these episodes may be improved by snacking  His father had  a long history of cerebral vascular disease and has died;  family history is positive for hypertension  Lab Results  Component Value Date   HGBA1C 5.6 12/20/2014     colonoscopy 2014   Wt Readings from Last 3 Encounters:  12/31/14 215 lb (97.523 kg)  06/14/14 217 lb 12.8 oz (98.793 kg)  04/24/14 210 lb (95.255 kg)    Past Medical History  Diagnosis Date  . HYPERTENSION 01/15/2008  . SHOULDER, PAIN 01/15/2008  . TESTOSTERONE DEFICIENCY 05/21/2008  . OSA on CPAP   . Sinus headache     "take OTC medications prn"  . TIA (transient ischemic attack) 04/23/2014    "that's what they think I've had" (04/23/2014)  . Arthritis     "shoulders" (04/23/2014)  . Bursitis of shoulder   . Tendonitis of shoulder     "both"    History   Social History  . Marital Status: Married    Spouse Name: N/A  . Number of Children: 5  . Years of Education: N/A   Occupational History  . teacher    Social History Main Topics  . Smoking status: Never Smoker   . Smokeless tobacco: Never Used  . Alcohol Use: No  .  Drug Use: No  . Sexual Activity: Yes   Other Topics Concern  . Not on file   Social History Narrative   Patient is right handed.   Patient drinks 1 cup caffeine daily    Past Surgical History  Procedure Laterality Date  . Plantar fascia surgery Left 7/10    heel  . Knee arthroscopy with patellar tendon repair Left 12/21/2012    Procedure: LEFT KNEE ARTHROSCOPY WITH MEDIAL and LATERALMENISECTOMY AND QUARDRICEP TENDON REPAIR;  Surgeon: Ninetta Lights, MD;  Location: [facility];  Service: Orthopedics;  Laterality: Left;  . Inguinal hernia repair Left 1973  . Tonsillectomy  1960's  . Appendectomy  1978    Family History  Problem Relation Age of Onset  . Colon cancer Neg Hx   . Stomach cancer Neg Hx   . Hypertension Mother   . Stroke Father   . Hypertension Sister     Allergies  Allergen Reactions  . Codeine Nausea And Vomiting    Current Outpatient Prescriptions on File Prior to Visit  Medication Sig Dispense Refill  . aspirin EC 81 MG EC tablet Take 1 tablet (81 mg total) by mouth daily.    . Chlorpheniramine-Phenylephrine (SINUS & ALLERGY PO) Take 1 tablet by mouth 2 (two) times daily as needed (allergies).    Marland Kitchen  lisinopril-hydrochlorothiazide (PRINZIDE,ZESTORETIC) 20-12.5 MG per tablet take half tablet by mouth once daily 45 tablet 3  . Testosterone (ANDROGEL PUMP) 20.25 MG/ACT (1.62%) GEL Place 2 application onto the skin daily. 150 g 1   No current facility-administered medications on file prior to visit.    BP 130/80 mmHg  Pulse 66  Temp(Src) 98.5 F (36.9 C) (Oral)  Resp 20  Ht 5\' 8"  (1.727 m)  Wt 215 lb (97.523 kg)  BMI 32.70 kg/m2  SpO2 96%      Review of Systems  Constitutional: Negative for fever, chills, activity change, appetite change and fatigue.  HENT: Negative for congestion, dental problem, ear pain, hearing loss, mouth sores, rhinorrhea, sinus pressure, sneezing, tinnitus, trouble swallowing and voice change.   Eyes: Negative  for photophobia, pain, redness and visual disturbance.  Respiratory: Negative for apnea, cough, choking, chest tightness, shortness of breath and wheezing.   Cardiovascular: Negative for chest pain, palpitations and leg swelling.  Gastrointestinal: Negative for nausea, vomiting, abdominal pain, diarrhea, constipation, blood in stool, abdominal distention, anal bleeding and rectal pain.  Genitourinary: Negative for dysuria, urgency, frequency, hematuria, flank pain, decreased urine volume, discharge, penile swelling, scrotal swelling, difficulty urinating, genital sores and testicular pain.  Musculoskeletal: Negative for arthralgias, back pain, gait problem, joint swelling, myalgias, neck pain and neck stiffness.  Skin: Negative for color change, rash and wound.  Neurological: Negative for dizziness, tremors, seizures, syncope, facial asymmetry, speech difficulty, weakness, light-headedness, numbness and headaches.  Hematological: Negative for adenopathy. Does not bruise/bleed easily.  Psychiatric/Behavioral: Negative for suicidal ideas, hallucinations, behavioral problems, confusion, sleep disturbance, self-injury, dysphoric mood, decreased concentration and agitation. The patient is not nervous/anxious.        Objective:   Physical Exam  Constitutional: He appears well-developed and well-nourished.  HENT:  Head: Normocephalic and atraumatic.  Right Ear: External ear normal.  Left Ear: External ear normal.  Nose: Nose normal.  Mouth/Throat: Oropharynx is clear and moist.  Early arcus senilis  Eyes: Conjunctivae and EOM are normal. Pupils are equal, round, and reactive to light. No scleral icterus.  Neck: Normal range of motion. Neck supple. No JVD present. No thyromegaly present.  Cardiovascular: Normal rate, regular rhythm, normal heart sounds and intact distal pulses.  Exam reveals no gallop and no friction rub.   No murmur heard. Left dorsalis pedis pulse diminished  Pulmonary/Chest:  Effort normal and breath sounds normal. He exhibits no tenderness.  Abdominal: Soft. Bowel sounds are normal. He exhibits no distension and no mass. There is no tenderness.  Genitourinary: Prostate normal and penis normal.  Musculoskeletal: Normal range of motion. He exhibits no edema and no tenderness.  Surgical scar, left knee  Lymphadenopathy:    He has no cervical adenopathy.  Neurological: He is alert. He has normal reflexes. No cranial nerve deficit. Coordination normal.  Skin: Skin is warm and dry. No rash noted.  Small callus versus plantar wart involving the arch of the left foot  Psychiatric: He has a normal mood and affect. His behavior is normal.          Assessment & Plan:   Preventive health examination Hypertension well controlled Testosterone deficiency ED OSA  continue home CPAP  Medical regimen unchanged. There's been some modest weight loss. Additional weight loss exercise regimen encouraged return in one year or as needed  Review of Systems  Musculoskeletal:       Left shoulder pain with decreased range of motion       Objective:   Physical  Exam  HENT:  Pharyngeal crowding  Cardiovascular:  Slight decreased left dorsalis pedis pulse          Assessment & Plan:   Preventive health examination OSA Testosterone deficiency Hypertension, stable Dyslipidemia  Additional weight loss encouraged  Recheck one year or as needed

## 2015-01-17 ENCOUNTER — Other Ambulatory Visit: Payer: Self-pay | Admitting: Internal Medicine

## 2015-06-24 ENCOUNTER — Other Ambulatory Visit: Payer: Self-pay | Admitting: Family Medicine

## 2015-06-24 MED ORDER — TESTOSTERONE 20.25 MG/ACT (1.62%) TD GEL
TRANSDERMAL | Status: DC
Start: 2015-06-24 — End: 2016-01-02

## 2015-06-24 NOTE — Telephone Encounter (Signed)
Rx for Androgel faxed to pharmacy. 

## 2015-11-09 IMAGING — CT CT HEAD W/O CM
1 series · 15 of 30 positions shown, 19 images · non-contrast
Comparison: None.

ADDENDUM:
Critical Value/emergent results were called by telephone at the time
of interpretation on 04/23/2014 at 1756am to Dr. Buruian, neurology ,
who verbally acknowledged these results.
CLINICAL DATA: Memory loss and altered mental status

EXAM:
CT HEAD WITHOUT CONTRAST
TECHNIQUE: Contiguous axial images were obtained from the base of the skull
through the vertex without intravenous contrast.

[Series 2: head 5.0 h30s · axial · 0.43mm/px · z∈[-130,+10]mm · 15 of 32 slices shown, 19 images]
[im 2/32  brain]
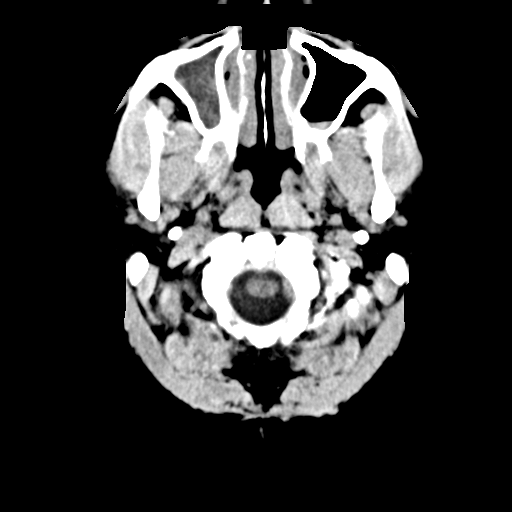
[im 2/32  bone]
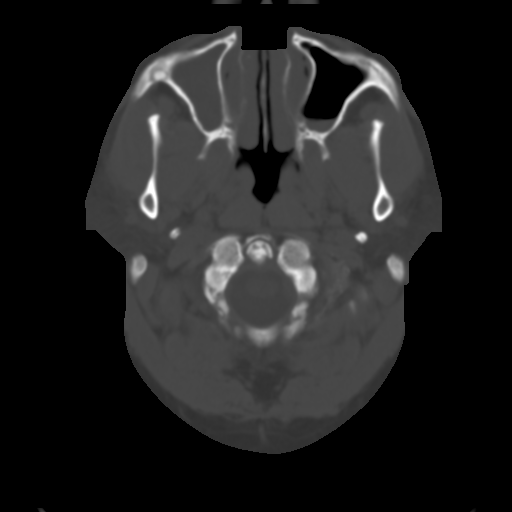
[im 4/32  brain]
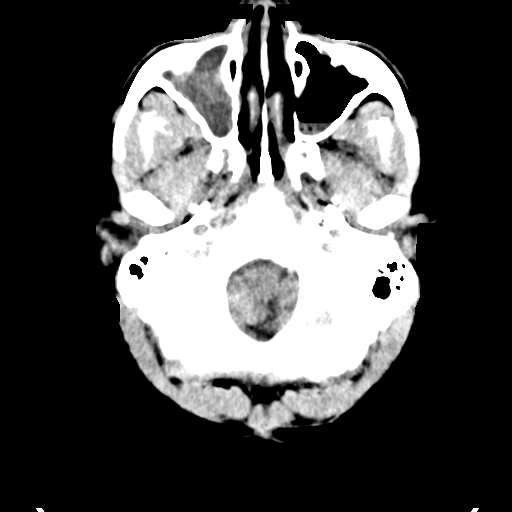
[im 6/32  brain]
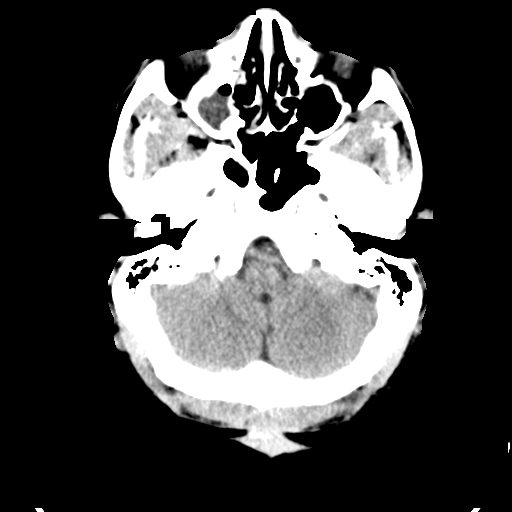
[im 8/32  brain]
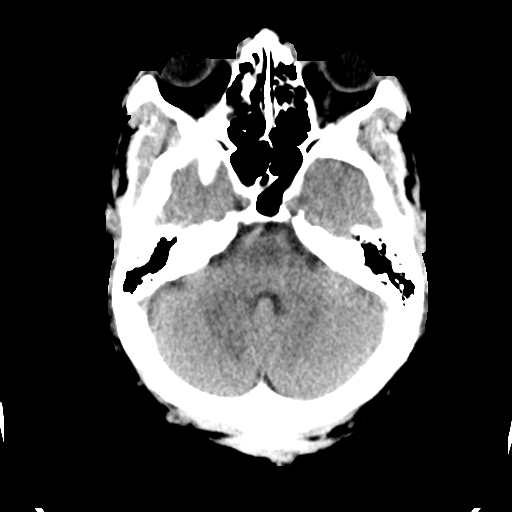
[im 10/32  brain]
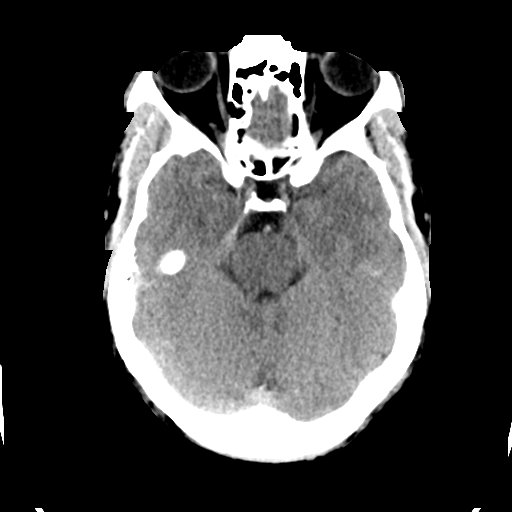
[im 10/32  bone]
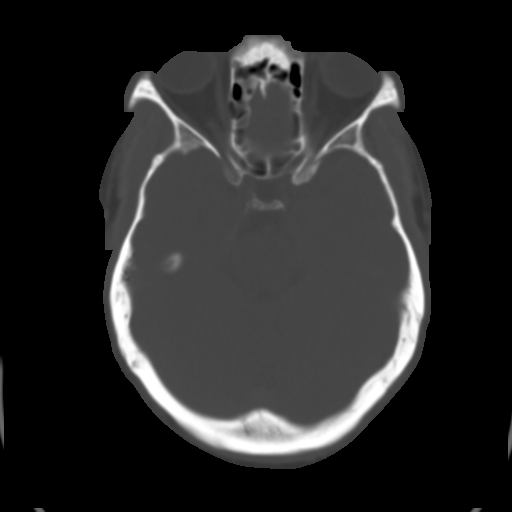
[im 12/32  brain]
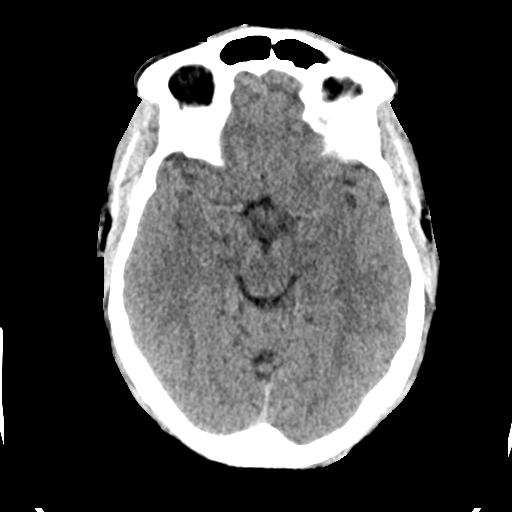
[im 14/32  brain]
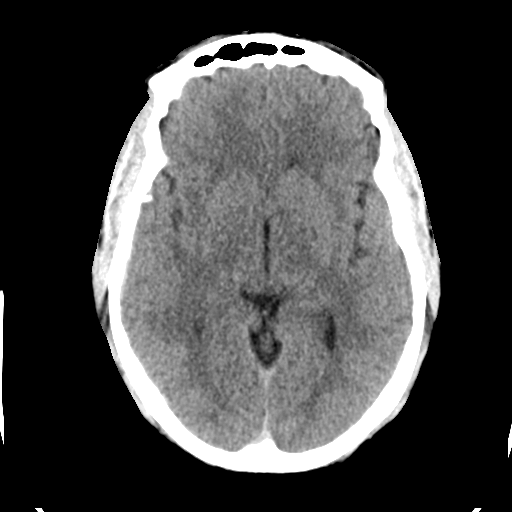
[im 17/32  brain]
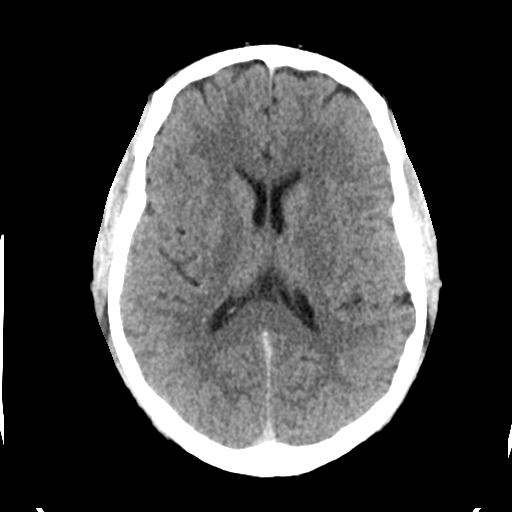
[im 18/32  brain]
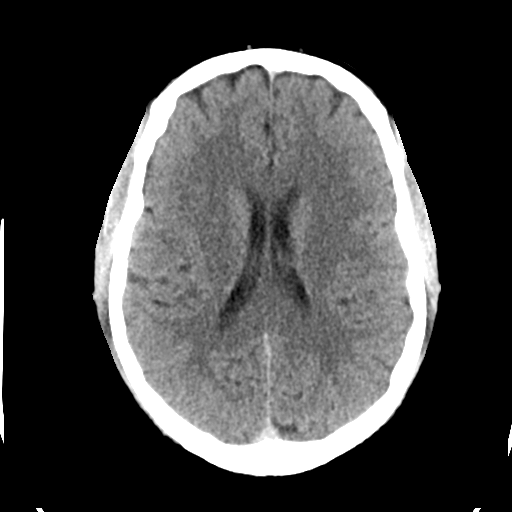
[im 18/32  bone]
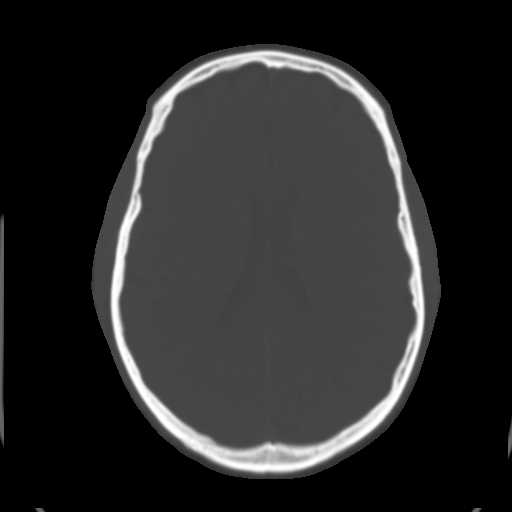
[im 20/32  brain]
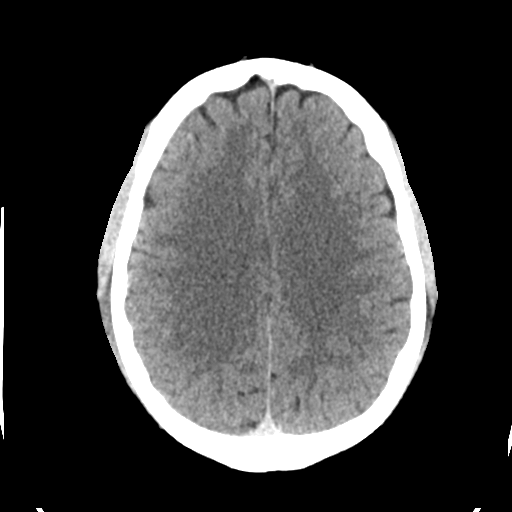
[im 22/32  brain]
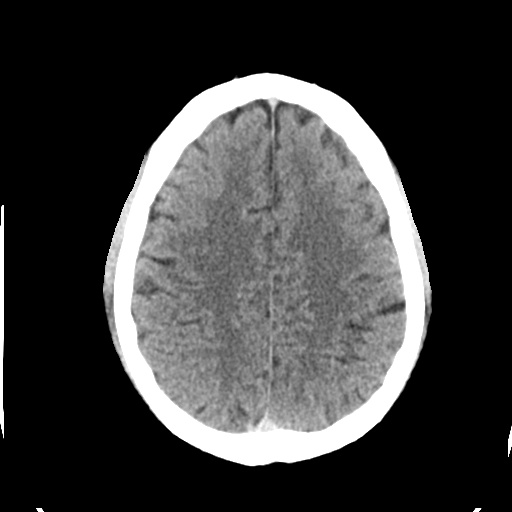
[im 24/32  brain]
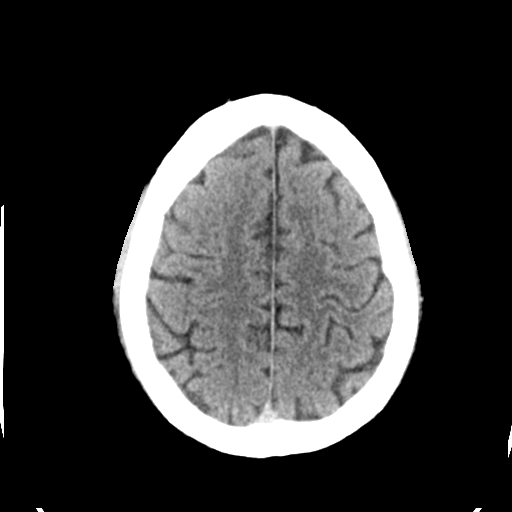
[im 26/32  brain]
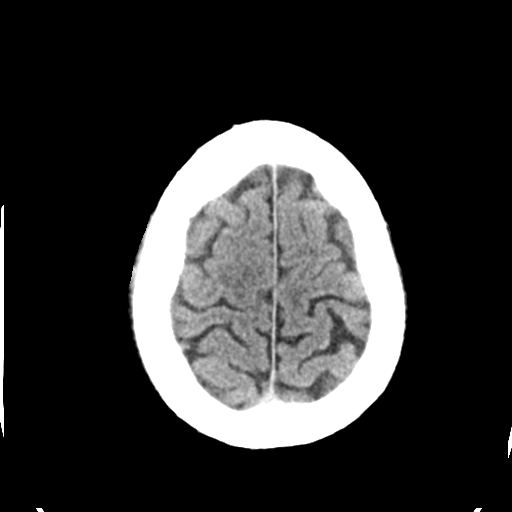
[im 26/32  bone]
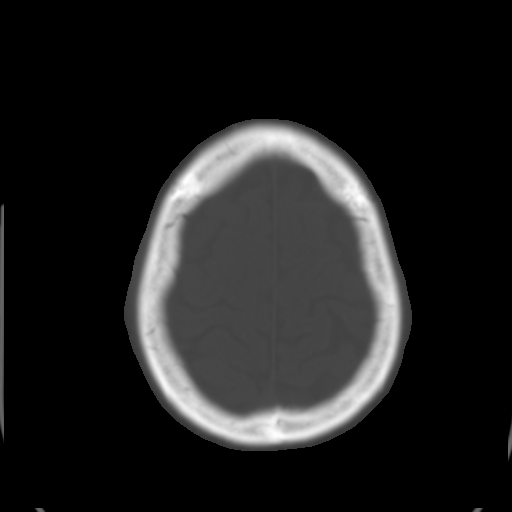
[im 28/32  brain]
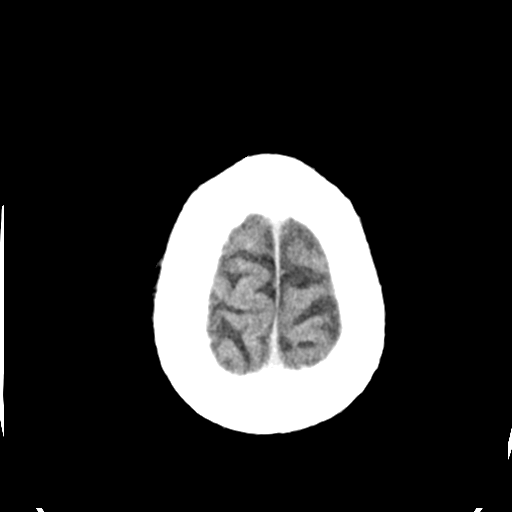
[im 30/32  brain]
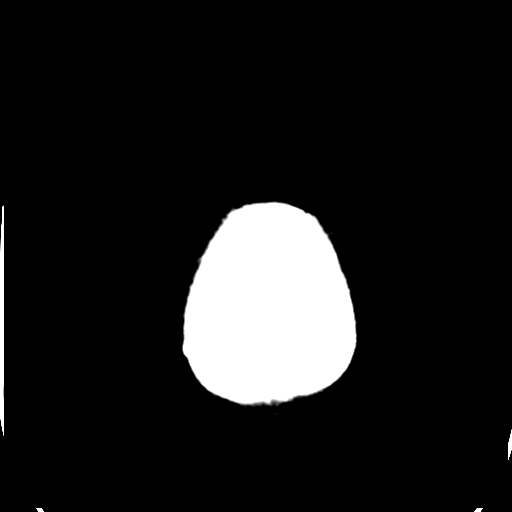

[15 of 30 positions shown; findings below may reference images not displayed]

FINDINGS: The ventricles are normal in size and configuration. There is no
mass, hemorrhage, extra-axial fluid collection, or midline shift.
Gray-white compartments are normal. No demonstrable acute infarct.
Bony calvarium appears intact. The mastoid air cells are clear.

There is an air-fluid level in the left maxillary antrum. There is
diffuse opacification of the right maxillary antrum. There is
opacification of multiple ethmoid air cells bilaterally as well as
the inferior right frontal sinus. There is diffuse nasal turbinate
edema bilaterally with nares obstruction bilaterally.
IMPRESSION: Extensive sinusitis as well as bilateral nares obstruction due to
nasal turbinate edema. Intracranially, there is no mass, hemorrhage,
or acute appearing infarct.

## 2015-12-26 ENCOUNTER — Other Ambulatory Visit (INDEPENDENT_AMBULATORY_CARE_PROVIDER_SITE_OTHER): Payer: BC Managed Care – PPO

## 2015-12-26 DIAGNOSIS — Z0000 Encounter for general adult medical examination without abnormal findings: Secondary | ICD-10-CM

## 2015-12-26 LAB — POC URINALSYSI DIPSTICK (AUTOMATED)
BILIRUBIN UA: NEGATIVE
GLUCOSE UA: NEGATIVE
KETONES UA: NEGATIVE
LEUKOCYTES UA: NEGATIVE
Nitrite, UA: NEGATIVE
Protein, UA: NEGATIVE
Spec Grav, UA: 1.025
Urobilinogen, UA: 0.2
pH, UA: 5.5

## 2015-12-26 LAB — HEPATIC FUNCTION PANEL
ALBUMIN: 4 g/dL (ref 3.5–5.2)
ALK PHOS: 73 U/L (ref 39–117)
ALT: 25 U/L (ref 0–53)
AST: 23 U/L (ref 0–37)
Bilirubin, Direct: 0.1 mg/dL (ref 0.0–0.3)
Total Bilirubin: 0.6 mg/dL (ref 0.2–1.2)
Total Protein: 7.2 g/dL (ref 6.0–8.3)

## 2015-12-26 LAB — BASIC METABOLIC PANEL
BUN: 11 mg/dL (ref 6–23)
CALCIUM: 9.3 mg/dL (ref 8.4–10.5)
CO2: 32 meq/L (ref 19–32)
Chloride: 101 mEq/L (ref 96–112)
Creatinine, Ser: 0.96 mg/dL (ref 0.40–1.50)
GFR: 102.39 mL/min (ref >60.00)
GLUCOSE: 99 mg/dL (ref 70–99)
Potassium: 4.4 mEq/L (ref 3.5–5.1)
SODIUM: 138 meq/L (ref 135–145)

## 2015-12-26 LAB — CBC WITH DIFFERENTIAL/PLATELET
BASOS ABS: 0 10*3/uL (ref 0.0–0.1)
Basophils Relative: 0.4 % (ref 0.0–3.0)
Eosinophils Absolute: 0.3 10*3/uL (ref 0.0–0.7)
Eosinophils Relative: 4.2 % (ref 0.0–5.0)
HCT: 42.6 % (ref 39.0–52.0)
Hemoglobin: 14.8 g/dL (ref 13.0–17.0)
LYMPHS ABS: 3 10*3/uL (ref 0.7–4.0)
Lymphocytes Relative: 37.1 % (ref 12.0–46.0)
MCHC: 34.7 g/dL (ref 30.0–36.0)
MCV: 91.1 fl (ref 78.0–100.0)
MONO ABS: 0.7 10*3/uL (ref 0.1–1.0)
MONOS PCT: 8.1 % (ref 3.0–12.0)
NEUTROS ABS: 4 10*3/uL (ref 1.4–7.7)
NEUTROS PCT: 50.2 % (ref 43.0–77.0)
PLATELETS: 153 10*3/uL (ref 150.0–400.0)
RBC: 4.68 Mil/uL (ref 4.22–5.81)
RDW: 12.9 % (ref 11.5–15.5)
WBC: 8 10*3/uL (ref 4.0–10.5)

## 2015-12-26 LAB — TSH: TSH: 1.22 u[IU]/mL (ref 0.35–4.50)

## 2015-12-26 LAB — LIPID PANEL
CHOLESTEROL: 198 mg/dL (ref 0–200)
HDL: 43.5 mg/dL (ref >39.00)
LDL Cholesterol: 139 mg/dL — ABNORMAL HIGH (ref 0–99)
NonHDL: 154.98
Total CHOL/HDL Ratio: 5
Triglycerides: 79 mg/dL (ref 0.0–149.0)
VLDL: 15.8 mg/dL (ref 0.0–40.0)

## 2015-12-26 LAB — HEMOGLOBIN A1C: Hgb A1c MFr Bld: 5.8 % (ref 4.6–6.5)

## 2015-12-26 LAB — PSA: PSA: 0.83 ng/mL (ref 0.10–4.00)

## 2015-12-26 LAB — TESTOSTERONE: Testosterone: 198.77 ng/dL — ABNORMAL LOW (ref 300.00–890.00)

## 2016-01-02 ENCOUNTER — Encounter: Payer: BC Managed Care – PPO | Admitting: Internal Medicine

## 2016-01-02 ENCOUNTER — Encounter: Payer: Self-pay | Admitting: Urgent Care

## 2016-01-02 ENCOUNTER — Encounter: Payer: Self-pay | Admitting: Internal Medicine

## 2016-01-02 ENCOUNTER — Ambulatory Visit (INDEPENDENT_AMBULATORY_CARE_PROVIDER_SITE_OTHER): Payer: BC Managed Care – PPO | Admitting: Urgent Care

## 2016-01-02 VITALS — BP 122/72 | HR 75 | Temp 98.2°F | Resp 17 | Ht 68.5 in | Wt 213.0 lb

## 2016-01-02 DIAGNOSIS — J302 Other seasonal allergic rhinitis: Secondary | ICD-10-CM

## 2016-01-02 DIAGNOSIS — Z0282 Encounter for adoption services: Secondary | ICD-10-CM

## 2016-01-02 DIAGNOSIS — Z029 Encounter for administrative examinations, unspecified: Secondary | ICD-10-CM | POA: Diagnosis not present

## 2016-01-02 DIAGNOSIS — I10 Essential (primary) hypertension: Secondary | ICD-10-CM | POA: Diagnosis not present

## 2016-01-02 NOTE — Patient Instructions (Addendum)
Hypertension Hypertension, commonly called high blood pressure, is when the force of blood pumping through your arteries is too strong. Your arteries are the blood vessels that carry blood from your heart throughout your body. A blood pressure reading consists of a higher number over a lower number, such as 110/72. The higher number (systolic) is the pressure inside your arteries when your heart pumps. The lower number (diastolic) is the pressure inside your arteries when your heart relaxes. Ideally you want your blood pressure below 120/80. Hypertension forces your heart to work harder to pump blood. Your arteries may become narrow or stiff. Having untreated or uncontrolled hypertension can cause heart attack, stroke, kidney disease, and other problems. RISK FACTORS Some risk factors for high blood pressure are controllable. Others are not.  Risk factors you cannot control include:   Race. You may be at higher risk if you are African American.  Age. Risk increases with age.  Gender. Men are at higher risk than women before age 45 years. After age 65, women are at higher risk than men. Risk factors you can control include:  Not getting enough exercise or physical activity.  Being overweight.  Getting too much fat, sugar, calories, or salt in your diet.  Drinking too much alcohol. SIGNS AND SYMPTOMS Hypertension does not usually cause signs or symptoms. Extremely high blood pressure (hypertensive crisis) may cause headache, anxiety, shortness of breath, and nosebleed. DIAGNOSIS To check if you have hypertension, your health care provider will measure your blood pressure while you are seated, with your arm held at the level of your heart. It should be measured at least twice using the same arm. Certain conditions can cause a difference in blood pressure between your right and left arms. A blood pressure reading that is higher than normal on one occasion does not mean that you need treatment. If  it is not clear whether you have high blood pressure, you may be asked to return on a different day to have your blood pressure checked again. Or, you may be asked to monitor your blood pressure at home for 1 or more weeks. TREATMENT Treating high blood pressure includes making lifestyle changes and possibly taking medicine. Living a healthy lifestyle can help lower high blood pressure. You may need to change some of your habits. Lifestyle changes may include:  Following the DASH diet. This diet is high in fruits, vegetables, and whole grains. It is low in salt, red meat, and added sugars.  Keep your sodium intake below 2,300 mg per day.  Getting at least 30-45 minutes of aerobic exercise at least 4 times per week.  Losing weight if necessary.  Not smoking.  Limiting alcoholic beverages.  Learning ways to reduce stress. Your health care provider may prescribe medicine if lifestyle changes are not enough to get your blood pressure under control, and if one of the following is true:  You are 18-59 years of age and your systolic blood pressure is above 140.  You are 60 years of age or older, and your systolic blood pressure is above 150.  Your diastolic blood pressure is above 90.  You have diabetes, and your systolic blood pressure is over 140 or your diastolic blood pressure is over 90.  You have kidney disease and your blood pressure is above 140/90.  You have heart disease and your blood pressure is above 140/90. Your personal target blood pressure may vary depending on your medical conditions, your age, and other factors. HOME CARE INSTRUCTIONS    Have your blood pressure rechecked as directed by your health care provider.   Take medicines only as directed by your health care provider. Follow the directions carefully. Blood pressure medicines must be taken as prescribed. The medicine does not work as well when you skip doses. Skipping doses also puts you at risk for  problems.  Do not smoke.   Monitor your blood pressure at home as directed by your health care provider. SEEK MEDICAL CARE IF:   You think you are having a reaction to medicines taken.  You have recurrent headaches or feel dizzy.  You have swelling in your ankles.  You have trouble with your vision. SEEK IMMEDIATE MEDICAL CARE IF:  You develop a severe headache or confusion.  You have unusual weakness, numbness, or feel faint.  You have severe chest or abdominal pain.  You vomit repeatedly.  You have trouble breathing. MAKE SURE YOU:   Understand these instructions.  Will watch your condition.  Will get help right away if you are not doing well or get worse.   This information is not intended to replace advice given to you by your health care provider. Make sure you discuss any questions you have with your health care provider.   Document Released: 05/17/2005 Document Revised: 10/01/2014 Document Reviewed: 03/09/2013 Elsevier Interactive Patient Education 2016 Else[employer]     IF you received an x-ray today, you will receive an invoice from [facility]. Please contact [facility] at [phone] with questions or concerns regarding your invoice.   IF you received labwork today, you will receive an invoice from [facility]. Please contact [facility] at [phone] with questions or concerns regarding your invoice.   Our billing staff will not be able to assist you with questions regarding bills from these companies.  You will be contacted with the lab results as soon as they are available. The fastest way to get your results is to activate your My Chart account. Instructions are located on the last page of this paperwork. If you have not heard from us regarding the results in 2 weeks, please contact this office.      

## 2016-01-02 NOTE — Progress Notes (Signed)
    MRN: [id number] DOB: 02-01-1956  Subjective:   Cesar [name] is a 60 y.o. male presenting for evaluation to become foster parent. He presents with form to be completed. 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 Has good relationships at home.  Admits history of HTN, managed well with lis-HCTZ. Has allergies managed with otc medications. Denies risk factors for tuberculosis. No PPD required. Denies smoking cigarettes or drinking alcohol.   Cesar [name] has a current medication list which includes the following prescription(s): aspirin, chlorpheniramine-phenylephrine, and lisinopril-hydrochlorothiazide. Also is allergic to codeine.  Cesar [name]  has a past medical history of Arthritis; Bursitis of shoulder; HYPERTENSION (01/15/2008); OSA on CPAP; SHOULDER, PAIN (01/15/2008); Sinus headache; Tendonitis of shoulder; TESTOSTERONE DEFICIENCY (05/21/2008); and TIA (transient ischemic attack) (04/23/2014). Also  has a past surgical history that includes Plantar fascia surgery (Left, 7/10); Knee arthroscopy with patellar tendon repair (Left, 12/21/2012); Inguinal hernia repair (Left, 1973); Tonsillectomy (1960's); and Appendectomy (1978).  His family history includes Hypertension in his mother and sister; Stroke in his father.   Objective:   Vitals: BP 122/72 (BP Location: Right Arm, Patient Position: Sitting, Cuff Size: Normal)   Pulse 75   Temp 98.2 F (36.8 C) (Oral)   Resp 17   Ht 5' 8.5" (1.74 m)   Wt 213 lb (96.6 kg)   SpO2 97%   BMI 31.92 kg/m   BP Readings from Last 3 Encounters:  01/02/16 122/72  12/31/14 130/80  06/14/14 119/72    Physical Exam  Constitutional: He is oriented to person, place, and time. He appears well-developed and well-nourished.  HENT:  Mouth/Throat: Oropharynx is clear and moist.  Eyes: No scleral icterus.  Neck: Normal range of motion. Neck supple. No thyromegaly present.  Cardiovascular: Normal rate, regular rhythm and intact distal pulses.  Exam  reveals no gallop and no friction rub.   No murmur heard. Pulmonary/Chest: No respiratory distress. He has no wheezes. He has no rales.  Musculoskeletal: He exhibits no edema.  Neurological: He is alert and oriented to person, place, and time.  Skin: Skin is warm and dry.  Psychiatric: He has a normal mood and affect.   Assessment and Plan :   1. Encounter for physical examination of prospective adoptive parent 2. Essential hypertension 3. Seasonal allergies - Forms completed. Patient is to rtc as needed.  Jaynee Eagles, PA-C [facility] [phone] 01/02/2016 3:10 PM

## 2016-02-24 ENCOUNTER — Other Ambulatory Visit: Payer: Self-pay | Admitting: Internal Medicine

## 2016-04-10 ENCOUNTER — Ambulatory Visit (INDEPENDENT_AMBULATORY_CARE_PROVIDER_SITE_OTHER): Payer: BC Managed Care – PPO | Admitting: Family Medicine

## 2016-04-10 ENCOUNTER — Encounter (HOSPITAL_COMMUNITY): Payer: Self-pay | Admitting: Adult Health

## 2016-04-10 ENCOUNTER — Emergency Department (HOSPITAL_COMMUNITY): Payer: BC Managed Care – PPO

## 2016-04-10 ENCOUNTER — Telehealth: Payer: Self-pay | Admitting: Radiology

## 2016-04-10 ENCOUNTER — Emergency Department (HOSPITAL_COMMUNITY)
Admission: EM | Admit: 2016-04-10 | Discharge: 2016-04-10 | Disposition: A | Discharge status: Home / Self Care | Payer: BC Managed Care – PPO | Attending: Emergency Medicine | Admitting: Emergency Medicine

## 2016-04-10 VITALS — BP 122/86 | HR 64 | Temp 97.6°F | Resp 17 | Ht 68.5 in | Wt 208.0 lb

## 2016-04-10 DIAGNOSIS — J329 Chronic sinusitis, unspecified: Secondary | ICD-10-CM | POA: Diagnosis not present

## 2016-04-10 DIAGNOSIS — R42 Dizziness and giddiness: Secondary | ICD-10-CM

## 2016-04-10 DIAGNOSIS — Z7982 Long term (current) use of aspirin: Secondary | ICD-10-CM | POA: Diagnosis not present

## 2016-04-10 DIAGNOSIS — I10 Essential (primary) hypertension: Secondary | ICD-10-CM

## 2016-04-10 DIAGNOSIS — I4510 Unspecified right bundle-branch block: Secondary | ICD-10-CM

## 2016-04-10 DIAGNOSIS — Z8673 Personal history of transient ischemic attack (TIA), and cerebral infarction without residual deficits: Secondary | ICD-10-CM

## 2016-04-10 DIAGNOSIS — H5500 Unspecified nystagmus: Secondary | ICD-10-CM

## 2016-04-10 DIAGNOSIS — H81399 Other peripheral vertigo, unspecified ear: Secondary | ICD-10-CM | POA: Diagnosis not present

## 2016-04-10 LAB — COMPREHENSIVE METABOLIC PANEL
ALT: 29 U/L (ref 17–63)
ANION GAP: 7 (ref 5–15)
AST: 30 U/L (ref 15–41)
Albumin: 3.7 g/dL (ref 3.5–5.0)
Alkaline Phosphatase: 64 U/L (ref 38–126)
BUN: 10 mg/dL (ref 6–20)
CHLORIDE: 103 mmol/L (ref 101–111)
CO2: 26 mmol/L (ref 22–32)
CREATININE: 1.02 mg/dL (ref 0.61–1.24)
Calcium: 9.1 mg/dL (ref 8.9–10.3)
Glucose, Bld: 155 mg/dL — ABNORMAL HIGH (ref 65–99)
POTASSIUM: 4.4 mmol/L (ref 3.5–5.1)
Sodium: 136 mmol/L (ref 135–145)
Total Bilirubin: 0.7 mg/dL (ref 0.3–1.2)
Total Protein: 7.6 g/dL (ref 6.5–8.1)

## 2016-04-10 LAB — POCT CBC
GRANULOCYTE PERCENT: 74.6 % (ref 37–80)
HEMATOCRIT: 42.8 % — AB (ref 43.5–53.7)
Hemoglobin: 15.5 g/dL (ref 14.1–18.1)
LYMPH, POC: 1.9 (ref 0.6–3.4)
MCH, POC: 33.2 pg — AB (ref 27–31.2)
MCHC: 36.4 g/dL — AB (ref 31.8–35.4)
MCV: 91.3 fL (ref 80–97)
MID (cbc): 0.1 (ref 0–0.9)
MPV: 7.8 fL (ref 0–99.8)
POC GRANULOCYTE: 5.9 (ref 2–6.9)
POC LYMPH %: 23.7 % (ref 10–50)
POC MID %: 1.7 % (ref 0–12)
Platelet Count, POC: 152 10*3/uL (ref 142–424)
RBC: 4.68 M/uL — AB (ref 4.69–6.13)
RDW, POC: 12.8 %
WBC: 7.9 10*3/uL (ref 4.6–10.2)

## 2016-04-10 LAB — GLUCOSE, POCT (MANUAL RESULT ENTRY): POC GLUCOSE: 110 mg/dL — AB (ref 70–99)

## 2016-04-10 MED ORDER — MECLIZINE HCL 25 MG PO TABS: 25.0000 mg | ORAL_TABLET | Freq: Three times a day (TID) | ORAL | 0 refills | Status: DC | PRN

## 2016-04-10 MED ORDER — DIAZEPAM 5 MG PO TABS: 2.5000 mg | ORAL_TABLET | Freq: Two times a day (BID) | ORAL | 1 refills | Status: DC | PRN

## 2016-04-10 MED ORDER — MECLIZINE HCL 25 MG PO TABS
50.0000 mg | ORAL_TABLET | Freq: Once | ORAL | Status: AC
  Administered 2016-04-10: 50 mg via ORAL
  Filled 2016-04-10: qty 2

## 2016-04-10 MED ORDER — LORAZEPAM 2 MG/ML IJ SOLN
1.0000 mg | Freq: Once | INTRAMUSCULAR | Status: AC
  Administered 2016-04-10: 1 mg via INTRAVENOUS
  Filled 2016-04-10: qty 1

## 2016-04-10 MED ORDER — FLUTICASONE PROPIONATE 50 MCG/ACT NA SUSP: 2.0000 | Freq: Every day | NASAL | 2 refills | Status: DC

## 2016-04-10 NOTE — ED Notes (Signed)
No pain anywhere no dizziness no complaints  Moves all 4  extremity

## 2016-04-10 NOTE — ED Notes (Signed)
Pt passed the swallow screen 

## 2016-04-10 NOTE — ED Notes (Signed)
Pt to mri med given for sedation

## 2016-04-10 NOTE — Discharge Instructions (Signed)
Take valium for vertigo as prescribed as needed. Drink plenty of fluids. Continue sinus medications over the counter. Flonase daily. Follow up with family doctor.

## 2016-04-10 NOTE — ED Notes (Signed)
Mri called it will be after 1900 before he will be scanned

## 2016-04-10 NOTE — ED Provider Notes (Signed)
[facility] DEPT Provider Note   CSN: [id number] Arrival date & time: 04/10/16  1359     History   Chief Complaint Chief Complaint  Patient presents with  . Dizziness    HPI Cesar [name] is a 60 y.o. male.  HPI Cesar [name] is a 60 y.o. male with a history of hypertension, possible TIA 2 years ago, presents to emergency department with vertigo-like symptoms. Patient states he woke up this morning, still laying in bed, he noticed that he felt like the room was spinning. When he tried to get up or walk, he ended up falling on the floor. Since then he has had difficulty with balance and walking. He states dizziness worsens when he changes position or turns his head. He denies any other neurological complaints. He denies headache. He denies any injuries during his fall. He went to [place] urgent care, initially they tried to set him up for outpatient MRI to rule out CVA as a cause of his vertigo, however they were unable to get him and so he was told to come to emergency department. Patient states he has nasal congestion and feels like his ears are "full." He denies any pain or ringing in his ears. He states he had a URI last week which improved. He states his brother has vertigo with similar symptoms.  Past Medical History:  Diagnosis Date  . Arthritis    "shoulders" (04/23/2014)  . Bursitis of shoulder   . HYPERTENSION 01/15/2008  . Hypertension   . OSA on CPAP   . SHOULDER, PAIN 01/15/2008  . Sinus headache    "take OTC medications prn"  . Tendonitis of shoulder    "both"  . TESTOSTERONE DEFICIENCY 05/21/2008  . TIA (transient ischemic attack) 04/23/2014   "that's what they think I've had" (04/23/2014)    Patient Active Problem List   Diagnosis Date Noted  . Sleep apnea syndrome 12/29/2010  . DIZZINESS 07/15/2010  . Dyslipidemia 07/14/2010  . TESTOSTERONE DEFICIENCY 05/21/2008  . Essential hypertension 01/15/2008  . SHOULDER, PAIN 01/15/2008    Past Surgical  History:  Procedure Laterality Date  . APPENDECTOMY  1978  . INGUINAL HERNIA REPAIR Left 1973  . KNEE ARTHROSCOPY WITH PATELLAR TENDON REPAIR Left 12/21/2012   Procedure: LEFT KNEE ARTHROSCOPY WITH MEDIAL and LATERALMENISECTOMY AND QUARDRICEP TENDON REPAIR;  Surgeon: Ninetta Lights, MD;  Location: [place];  Service: Orthopedics;  Laterality: Left;  . PLANTAR FASCIA SURGERY Left 7/10   heel  . TONSILLECTOMY  1960's       Home Medications    Prior to Admission medications   Medication Sig Start Date End Date Taking? Authorizing Provider  aspirin EC 81 MG EC tablet Take 1 tablet (81 mg total) by mouth daily. 04/24/14   Shanker Kristeen Mans, MD  Chlorpheniramine-Phenylephrine (SINUS & ALLERGY PO) Take 1 tablet by mouth 2 (two) times daily as needed (allergies).    Historical Provider, MD  diazepam (VALIUM) 5 MG tablet Take 0.5-1 tablets (2.5-5 mg total) by mouth every 12 (twelve) hours as needed for anxiety. Or vertigo. 04/10/16   [name], MD  lisinopril-hydrochlorothiazide Reita May) 20-12.5 MG tablet take 1/2 tablet by mouth once daily 02/24/16   Marletta Lor, MD  meclizine (ANTIVERT) 25 MG tablet Take 1 tablet (25 mg total) by mouth 3 (three) times daily as needed for dizziness. 04/10/16   [name], MD    Family History Family History  Problem Relation Age of Onset  .  Hypertension Mother   . Stroke Father   . Hypertension Sister   . Colon cancer Neg Hx   . Stomach cancer Neg Hx     Social History Social History  Substance Use Topics  . Smoking status: Never Smoker  . Smokeless tobacco: Never Used  . Alcohol use No     Allergies   Codeine   Review of Systems Review of Systems  Constitutional: Negative for chills and fever.  HENT: Positive for congestion. Negative for ear pain and sore throat.   Eyes: Negative for pain and visual disturbance.  Respiratory: Negative for cough and shortness of breath.   Cardiovascular:  Negative for chest pain and palpitations.  Gastrointestinal: Negative for abdominal pain and vomiting.  Genitourinary: Negative for dysuria and hematuria.  Musculoskeletal: Negative for arthralgias and back pain.  Skin: Negative for color change and rash.  Neurological: Positive for dizziness. Negative for seizures, syncope and headaches.  All other systems reviewed and are negative.    Physical Exam Updated Vital Signs BP 141/83   Pulse (!) 58   Temp 98.1 F (36.7 C) (Oral)   Resp 16   SpO2 98%   Physical Exam  Constitutional: He is oriented to person, place, and time. He appears well-developed and well-nourished. No distress.  HENT:  Head: Normocephalic and atraumatic.  Right Ear: External ear normal.  Nose: Nose normal.  Mouth/Throat: Oropharynx is clear and moist.  Eyes: Conjunctivae and EOM are normal. Pupils are equal, round, and reactive to light.  No nystagmus  Neck: Normal range of motion. Neck supple.  Cardiovascular: Normal rate, regular rhythm and normal heart sounds.   Pulmonary/Chest: Effort normal. No respiratory distress. He has no wheezes. He has no rales.  Abdominal: Soft. Bowel sounds are normal. He exhibits no distension. There is no tenderness. There is no rebound.  Musculoskeletal: He exhibits no edema.  Neurological: He is alert and oriented to person, place, and time.  5/5 and equal upper and lower extremity strength bilaterally. Equal grip strength bilaterally. Normal finger to nose and heel to shin. No pronator drift. Patellar reflexes 2+   Skin: Skin is warm and dry. Capillary refill takes less than 2 seconds.  Nursing note and vitals reviewed.    ED Treatments / Results  Labs (all labs ordered are listed, but only abnormal results are displayed) Labs Reviewed  COMPREHENSIVE METABOLIC PANEL - Abnormal; Notable for the following:       Result Value   Glucose, Bld 155 (*)    All other components within normal limits    EKG  EKG  Interpretation  Date/Time:  Saturday April 10 2016 14:05:48 EST Ventricular Rate:  60 PR Interval:  154 QRS Duration: 128 QT Interval:  454 QTC Calculation: [id number] R Axis:   24 Text Interpretation:  Normal sinus rhythm Right bundle branch block Abnormal ECG no significant change since 2015 Confirmed by GOLDSTON MD, SCOTT [phone]) on 04/10/2016 3:02:41 PM       Radiology Mr Brain Wo Contrast  Result Date: 04/10/2016 CLINICAL DATA:  Vertigo EXAM: MRI HEAD WITHOUT CONTRAST TECHNIQUE: Multiplanar, multiecho pulse sequences of the brain and surrounding structures were obtained without intravenous contrast. COMPARISON:  MRI 04/23/2014 FINDINGS: Brain: Negative for acute infarct. Several small white matter hyperintensities which are chronic and unchanged. Ventricle size normal. Cerebral volume normal. Empty sella unchanged from the prior study. Negative for hemorrhage or mass lesion. Vascular: Normal arterial flow voids. Skull and upper cervical spine: Negative Sinuses/Orbits: Extensive mucosal edema in the  paranasal sinuses with air-fluid levels in the maxillary sinus bilaterally. Normal orbit. Other: None IMPRESSION: No acute intracranial abnormality. Mild chronic white matter changes similar to the prior study Sinusitis with air-fluid levels. Electronically Signed   By: Franchot Gallo M.D.   On: 04/10/2016 19:13    Procedures Procedures (including critical care time)  Medications Ordered in ED Medications  meclizine (ANTIVERT) tablet 50 mg (not administered)     Initial Impression / Assessment and Plan / ED Course  I have reviewed the triage vital signs and the nursing notes.  Pertinent labs & imaging results that were available during my care of the patient were reviewed by me and considered in my medical decision making (see chart for details).  Clinical Course    3:10 PM Pt seen and examined. Pt with vertigo like symptoms onset this morning. No symptoms if laying down still.  Normal neurological exam. Will get labs, CBC already obtained by PCP and in the computer. Will check ECG, CMP, UA, will get MR brain  7:26 PM MR negative, other than sinusitis. Pt reports chronic sinus issues. Takes OTC sinus mediation. Will add flonase. Pt has prescription for valium that he will take as needed for vertigo. Will have him follow up with pcp if symptoms not improving. At this time pt is feeling better in ED. No dizziness. Return precautions discussed.   Vitals:   04/10/16 1700 04/10/16 1730 04/10/16 1800 04/10/16 1920  BP: 129/78 134/82 136/70 143/83  Pulse: 69 (!) 59 (!) 56 68  Resp: 19 18 20 16   Temp:      TempSrc:      SpO2: 100% 99% 99%      Final Clinical Impressions(s) / ED Diagnoses   Final diagnoses:  Vertigo  Sinusitis, unspecified chronicity, unspecified location    New Prescriptions New Prescriptions   FLUTICASONE (FLONASE) 50 MCG/ACT NASAL SPRAY    Place 2 sprays into both nostrils daily.     Jeannett Senior, PA-C 04/10/16 1932    Jeannett Senior, PA-C 04/10/16 [place], MD 04/10/16 1958

## 2016-04-10 NOTE — Progress Notes (Addendum)
Subjective:  By signing my name below, I, Cesar [name], attest that this documentation has been prepared under the direction and in the presence of [name], MD. Electronically Signed: [name], [place]. 04/10/2016 , 12:20 PM .  Patient was seen in Room 12 .   Patient ID: Cesar [name], male    DOB: [birth year], 60 y.o.   MRN: [id number] Chief Complaint  Patient presents with  . Dizziness    Pt states woke up this morning and "room was spinning"   HPI Cesar [name] is a 60 y.o. male  Patient states he woke up this morning feeling off balance with the room spinning. When he initially woke up this morning and sat up at 6:00AM, he moved his head and felt the bed and room were spinning. He felt nauseated but no vomiting. When he got out of his bed, he fell onto the carpet floor and was able to break his fall. He denies hitting his head or any injuries. He denies LOC. He was able to reach his bathroom and sat still to calm down the dizziness.   He decided to lay back in bed for some relief. When he got back up out of bed, he felt off balance again. He had to stand up slow and calm down in a lounge chair. His wife drove him to the office today. He denies slurred speech, facial droop, weakness, headache or blurry vision. He denies history of true stroke. He's only taken aspirin when he has a headache. His brother has history of vertigo and had similar symptoms.   He was thought to have possible TIA in Nov 24th, 2015. He had MRI done, which showed no acute intracranial abnormality. His A1C was slightly elevated at that time, but rechecked in July, at 5.8. He followed up with a neurologist, who believed it was due to A1C at 6.3.   He had a large dinner from carry-out last night. He stuffed himself with fish with collared greens, and mac & cheese. He sleeps with a cpap. He sleeps with ear buds in with rain sounds.   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 Sx's for years, no recent changes and discussed with his PCP in past.   Patient Active Problem List   Diagnosis Date Noted  . Sleep apnea syndrome 12/29/2010  . DIZZINESS 07/15/2010  . Dyslipidemia 07/14/2010  . TESTOSTERONE DEFICIENCY 05/21/2008  . Essential hypertension 01/15/2008  . SHOULDER, PAIN 01/15/2008   Past Medical History:  Diagnosis Date  . Arthritis    "shoulders" (04/23/2014)  . Bursitis of shoulder   . HYPERTENSION 01/15/2008  . Hypertension   . OSA on CPAP   . SHOULDER, PAIN 01/15/2008  . Sinus headache    "take OTC medications prn"  . Tendonitis of shoulder    "both"  . TESTOSTERONE DEFICIENCY 05/21/2008  . TIA (transient ischemic attack) 04/23/2014   "that's what they think I've had" (04/23/2014)   Past Surgical History:  Procedure Laterality Date  . APPENDECTOMY  1978  . INGUINAL HERNIA REPAIR Left 1973  . KNEE ARTHROSCOPY WITH PATELLAR TENDON REPAIR Left 12/21/2012   Procedure: LEFT KNEE ARTHROSCOPY WITH MEDIAL and LATERALMENISECTOMY AND QUARDRICEP TENDON REPAIR;  Surgeon: Ninetta Lights, MD;  Location: [facility];  Service: Orthopedics;  Laterality: Left;  . PLANTAR FASCIA SURGERY Left 7/10   heel  . TONSILLECTOMY  1960's   Allergies  Allergen Reactions  .  Codeine Nausea And Vomiting   Prior to Admission medications   Medication Sig Start Date End Date Taking? Authorizing Provider  aspirin EC 81 MG EC tablet Take 1 tablet (81 mg total) by mouth daily. 04/24/14  Yes Shanker Kristeen Mans, MD  Chlorpheniramine-Phenylephrine (SINUS & ALLERGY PO) Take 1 tablet by mouth 2 (two) times daily as needed (allergies).   Yes Historical Provider, MD  lisinopril-hydrochlorothiazide Reita May) 20-12.5 MG tablet take 1/2 tablet by mouth once daily 02/24/16  Yes Marletta Lor, MD   Social History   Social History  . Marital status: Married    Spouse name: N/A  . Number of children: 5  . Years of education: N/A    Occupational History  . teacher    Social History Main Topics  . Smoking status: Never Smoker  . Smokeless tobacco: Never Used  . Alcohol use No  . Drug use: No  . Sexual activity: Yes   Other Topics Concern  . Not on file   Social History Narrative   Patient is right handed.   Patient drinks 1 cup caffeine daily   Review of Systems  Constitutional: Negative for fatigue and unexpected weight change.  Eyes: Negative for visual disturbance.  Respiratory: Negative for cough, chest tightness and shortness of breath.   Cardiovascular: Negative for chest pain, palpitations and leg swelling.  Gastrointestinal: Negative for abdominal pain and [name] in stool.  Skin: Negative for rash and wound.  Neurological: Positive for dizziness. Negative for facial asymmetry, speech difficulty, weakness, light-headedness and headaches.       Objective:   Physical Exam  Constitutional: He is oriented to person, place, and time. He appears well-developed and well-nourished.  HENT:  Head: Normocephalic and atraumatic.  Eyes: EOM are normal. Pupils are equal, round, and reactive to light.  Horizontal nystagmus going to the right but some vertical nystagmus initially laying supine.  Sitting back up, few horizontal beats to the right   Neck: No JVD present. Carotid bruit is not present.  Cardiovascular: Normal rate, regular rhythm and normal heart sounds.   No murmur heard. Pulmonary/Chest: Effort normal and breath sounds normal. He has no rales.  Musculoskeletal: He exhibits no edema.  Neurological: He is alert and oriented to person, place, and time.  Romberg limited, as too dizzy at the time No pronator drift, no focal weakness, normal speech, no facial droop, [place] [facility] scale negative   Skin: Skin is warm and dry.  Psychiatric: He has a normal mood and affect.  Vitals reviewed.   Vitals:   04/10/16 1125  BP: 122/86  Pulse: 64  Resp: 17  Temp: 97.6 F (36.4 C)  TempSrc:  Oral  SpO2: 98%  Weight: 208 lb (94.3 kg)  Height: 5' 8.5" (1.74 m)   EKG: sinus rhythm rate 61, QT 438, RBBB, QRS 134; RBBB appears to be new from prior EKG, but no other acute findings  Orthostatic VS for the past 24 hrs (Last 3 readings):  BP- Lying Pulse- Lying BP- Sitting Pulse- Sitting BP- Standing at 0 minutes Pulse- Standing at 0 minutes  04/10/16 1233 (!) 156/94 60 156/87 65 (!) 158/92 65   Results for orders placed or performed in visit on 04/10/16  POCT CBC  Result Value Ref Range   WBC 7.9 4.6 - 10.2 K/uL   Lymph, poc 1.9 0.6 - 3.4   POC LYMPH PERCENT 23.7 10 - 50 %L   MID (cbc) 0.1 0 - 0.9   POC MID % 1.7  0 - 12 %M   POC Granulocyte 5.9 2 - 6.9   Granulocyte percent 74.6 37 - 80 %G   RBC 4.68 (A) 4.69 - 6.13 M/uL   Hemoglobin 15.5 14.1 - 18.1 g/dL   HCT, POC 42.8 (A) 43.5 - 53.7 %   MCV 91.3 80 - 97 fL   MCH, POC 33.2 (A) 27 - 31.2 pg   MCHC 36.4 (A) 31.8 - 35.4 g/dL   RDW, POC 12.8 %   Platelet Count, POC 152 142 - 424 K/uL   MPV 7.8 0 - 99.8 fL  POCT glucose (manual entry)  Result Value Ref Range   POC Glucose 110 (A) 70 - 99 mg/dl    Over 40 minutes of direct patient care plus coordination of care. Sent to emergency room as below due to possible severity of illness.    Assessment & Plan:   LAZAR FUNARO is a 60 y.o. male Personal history of TIA (transient ischemic attack) - Plan: MR MRA Head/Brain Wo Cm Nystagmus - Plan: MR MRA Head/Brain Wo Cm Dizziness - Plan: POCT CBC, POCT glucose (manual entry), Orthostatic vital signs, EKG 12-Lead, MR MRA Head/Brain Wo Cm Vertigo - Plan: Ambulatory referral to Cardiology, MR MRA Head/Brain Wo Cm  - new onset vertigo when woke up at 6:30am. Primarily horizontal nystagmus, but possible vertical nystagmus seen initially. Able to relieve sx's for most part with sitting still. nonfocal neuro exam otherwise, but  Risk factors of HTN, and possible prior TIA, and off aspirin.   -discussed with neurologist on call - agreed  that MRI/MRA needed today - if unable to obtain outpatient, advised to go to ER.   - after attempts at OP scheduling, unable to schedule outpatient - advised patient to present to [facility] for eval. Charge nurse advised.    Essential hypertension - Plan: Ambulatory referral to Cardiology  - stable in office.   RBBB - Plan: Ambulatory referral to Cardiology  - new onset, but denies cardiac sx's. RBBB, without other acute findings. Will refer to cardiology this week, but ER/RTC precautions discussed    Meds ordered this encounter  Medications  . diazepam (VALIUM) 5 MG tablet    Sig: Take 0.5-1 tablets (2.5-5 mg total) by mouth every 12 (twelve) hours as needed for anxiety. Or vertigo.    Dispense:  30 tablet    Refill:  1  . meclizine (ANTIVERT) 25 MG tablet    Sig: Take 1 tablet (25 mg total) by mouth 3 (three) times daily as needed for dizziness.    Dispense:  30 tablet    Refill:  0   Patient Instructions   We will arrange an MRI today to make sure that there has not been any sign of stroke. If any new symptoms - go straight to emergency room.  If MRI is ok, then your symptoms appear to be due to vertigo. You can take meclizine 3 times per day as needed, Valium twice per day as needed, and if not improving within the next 3-4 days, return for recheck. There are some exercises below that also may help with vertigo if this is due to peripheral vertigo.  I will refer you to a cardiologist as your EKG is slightly different than previous. However if any chest pain, palpitations, or other new or worsening symptoms, return here or the emergency room.  Vertigo Vertigo means you feel like you or your surroundings are moving when they are not. Vertigo can be dangerous if it occurs  when you are at work, driving, or performing difficult activities.  CAUSES  Vertigo occurs when there is a conflict of signals sent to your brain from the visual and sensory systems in your body. There are many different  causes of vertigo, including:  Infections, especially in the inner ear.  A bad reaction to a drug or misuse of alcohol and medicines.  Withdrawal from drugs or alcohol.  Rapidly changing positions, such as lying down or rolling over in bed.  A migraine headache.  Decreased [name] flow to the brain.  Increased pressure in the brain from a head injury, infection, tumor, or bleeding. SYMPTOMS  You may feel as though the world is spinning around or you are falling to the ground. Because your balance is upset, vertigo can cause nausea and vomiting. You may have involuntary eye movements (nystagmus). DIAGNOSIS  Vertigo is usually diagnosed by physical exam. If the cause of your vertigo is unknown, your caregiver may perform imaging tests, such as an MRI scan (magnetic resonance imaging). TREATMENT  Most cases of vertigo resolve on their own, without treatment. Depending on the cause, your caregiver may prescribe certain medicines. If your vertigo is related to body position issues, your caregiver may recommend movements or procedures to correct the problem. In rare cases, if your vertigo is caused by certain inner ear problems, you may need surgery. HOME CARE INSTRUCTIONS   Follow your caregiver's instructions.  Avoid driving.  Avoid operating heavy machinery.  Avoid performing any tasks that would be dangerous to you or others during a vertigo episode.  Tell your caregiver if you notice that certain medicines seem to be causing your vertigo. Some of the medicines used to treat vertigo episodes can actually make them worse in some people. SEEK IMMEDIATE MEDICAL CARE IF:   Your medicines do not relieve your vertigo or are making it worse.  You develop problems with talking, walking, weakness, or using your arms, hands, or legs.  You develop severe headaches.  Your nausea or vomiting continues or gets worse.  You develop visual changes.  A family member notices behavioral  changes.  Your condition gets worse. MAKE SURE YOU:  Understand these instructions.  Will watch your condition.  Will get help right away if you are not doing well or get worse.   This information is not intended to replace advice given to you by your health care provider. Make sure you discuss any questions you have with your health care provider.   Document Released: 02/24/2005 Document Revised: 08/09/2011 Document Reviewed: 09/09/2014 Elsevier Interactive Patient Education 2016 [employer].    [employer] Self-Care WHAT IS THE EPLEY MANEUVER? The Epley maneuver is an exercise you can do to relieve symptoms of benign paroxysmal positional vertigo (BPPV). This condition is often just referred to as vertigo. BPPV is caused by the movement of tiny crystals (canaliths) inside your inner ear. The accumulation and movement of canaliths in your inner ear causes a sudden spinning sensation (vertigo) when you move your head to certain positions. Vertigo usually lasts about 30 seconds. BPPV usually occurs in just one ear. If you get vertigo when you lie on your left side, you probably have BPPV in your left ear. Your health care provider can tell you which ear is involved.  BPPV may be caused by a head injury. Many people older than 50 get BPPV for unknown reasons. If you have been diagnosed with BPPV, your health care provider may teach you how to do this  maneuver. BPPV is not life threatening (benign) and usually goes away in time.  WHEN SHOULD I PERFORM THE EPLEY MANEUVER? You can do this maneuver at home whenever you have symptoms of vertigo. You may do the Epley maneuver up to 3 times a day until your symptoms of vertigo go away. HOW SHOULD I DO THE EPLEY MANEUVER? 1. Sit on the edge of a bed or table with your back straight. Your legs should be extended or hanging over the edge of the bed or table.  2. Turn your head halfway toward the affected ear.  3. Lie backward quickly with your  head turned until you are lying flat on your back. You may want to position a pillow under your shoulders.  4. Hold this position for 30 seconds. You may experience an attack of vertigo. This is normal. Hold this position until the vertigo stops. 5. Then turn your head to the opposite direction until your unaffected ear is facing the floor.  6. Hold this position for 30 seconds. You may experience an attack of vertigo. This is normal. Hold this position until the vertigo stops. 7. Now turn your whole body to the same side as your head. Hold for another 30 seconds.  8. You can then sit back up. ARE THERE RISKS TO THIS MANEUVER? In some cases, you may have other symptoms (such as changes in your vision, weakness, or numbness). If you have these symptoms, stop doing the maneuver and call your health care provider. Even if doing these maneuvers relieves your vertigo, you may still have dizziness. Dizziness is the sensation of light-headedness but without the sensation of movement. Even though the Epley maneuver may relieve your vertigo, it is possible that your symptoms will return within 5 years. WHAT SHOULD I DO AFTER THIS MANEUVER? After doing the Epley maneuver, you can return to your normal activities. Ask your doctor if there is anything you should do at home to prevent vertigo. This may include:  Sleeping with two or more pillows to keep your head elevated.  Not sleeping on the side of your affected ear.  Getting up slowly from bed.  Avoiding sudden movements during the day.  Avoiding extreme head movement, like looking up or bending over.  Wearing a cervical collar to prevent sudden head movements. WHAT SHOULD I DO IF MY SYMPTOMS GET WORSE? Call your health care provider if your vertigo gets worse. Call your provider right way if you have other symptoms, including:   Nausea.  Vomiting.  Headache.  Weakness.  Numbness.  Vision changes.   This information is not intended to  replace advice given to you by your health care provider. Make sure you discuss any questions you have with your health care provider.   Document Released: 05/22/2013 Document Reviewed: 05/22/2013 Elsevier Interactive Patient Education [employer].    IF you received an x-ray today, you will receive an invoice from [facility]. Please contact [facility] at [phone] with questions or concerns regarding your invoice.   IF you received labwork today, you will receive an invoice from [employer]. Please contact [facility] at [phone] with questions or concerns regarding your invoice.   Our billing staff will not be able to assist you with questions regarding bills from these companies.  You will be contacted with the lab results as soon as they are available. The fastest way to get your results is to activate your My Chart account. Instructions are located on the last  page of this paperwork. If you have not heard from [place] regarding the results in 2 weeks, please contact this office.        I personally performed the services described in this documentation, which was scribed in my presence. The recorded information has been reviewed and considered, and addended by me as needed.   Signed,   [name], MD [facility].  04/10/16 1:26 PM

## 2016-04-10 NOTE — ED Notes (Signed)
Pt states he fell down this morning due to room spinning.  Denies injury.  Family at bedside.

## 2016-04-10 NOTE — Telephone Encounter (Signed)
I spoke to [facility] today, can not get MRA unless patient goes through the Emergency Room. Spoke to [facility] at [facility] and Museum/gallery conservator at [facility].

## 2016-04-10 NOTE — ED Notes (Signed)
No pain  No headache  Alert and oriented skin warm and dry

## 2016-04-10 NOTE — ED Notes (Signed)
Pt still waiting for mri

## 2016-04-10 NOTE — Patient Instructions (Addendum)
Unfortunately you will need to be seen through emergency room today.  proceed there after our office. If any change in symptoms prior to ER evaluation - call 911.   If MRI is ok, then your symptoms appear to be due to vertigo. You can take meclizine 3 times per day as needed, Valium twice per day as needed, and if not improving within the next 3-4 days, return for recheck. There are some exercises below that also may help with vertigo if this is due to peripheral vertigo.  I will refer you to a cardiologist as your EKG is slightly different than previous. However if any chest pain, palpitations, or other new or worsening symptoms, return here or the emergency room.  Vertigo Vertigo means you feel like you or your surroundings are moving when they are not. Vertigo can be dangerous if it occurs when you are at work, driving, or performing difficult activities.  CAUSES  Vertigo occurs when there is a conflict of signals sent to your brain from the visual and sensory systems in your body. There are many different causes of vertigo, including:  Infections, especially in the inner ear.  A bad reaction to a drug or misuse of alcohol and medicines.  Withdrawal from drugs or alcohol.  Rapidly changing positions, such as lying down or rolling over in bed.  A migraine headache.  Decreased blood flow to the brain.  Increased pressure in the brain from a head injury, infection, tumor, or bleeding. SYMPTOMS  You may feel as though the world is spinning around or you are falling to the ground. Because your balance is upset, vertigo can cause nausea and vomiting. You may have involuntary eye movements (nystagmus). DIAGNOSIS  Vertigo is usually diagnosed by physical exam. If the cause of your vertigo is unknown, your caregiver may perform imaging tests, such as an MRI scan (magnetic resonance imaging). TREATMENT  Most cases of vertigo resolve on their own, without treatment. Depending on the cause, your  caregiver may prescribe certain medicines. If your vertigo is related to body position issues, your caregiver may recommend movements or procedures to correct the problem. In rare cases, if your vertigo is caused by certain inner ear problems, you may need surgery. HOME CARE INSTRUCTIONS   Follow your caregiver's instructions.  Avoid driving.  Avoid operating heavy machinery.  Avoid performing any tasks that would be dangerous to you or others during a vertigo episode.  Tell your caregiver if you notice that certain medicines seem to be causing your vertigo. Some of the medicines used to treat vertigo episodes can actually make them worse in some people. SEEK IMMEDIATE MEDICAL CARE IF:   Your medicines do not relieve your vertigo or are making it worse.  You develop problems with talking, walking, weakness, or using your arms, hands, or legs.  You develop severe headaches.  Your nausea or vomiting continues or gets worse.  You develop visual changes.  A family member notices behavioral changes.  Your condition gets worse. MAKE SURE YOU:  Understand these instructions.  Will watch your condition.  Will get help right away if you are not doing well or get worse.   This information is not intended to replace advice given to you by your health care provider. Make sure you discuss any questions you have with your health care provider.   Document Released: 02/24/2005 Document Revised: 08/09/2011 Document Reviewed: 09/09/2014 Elsevier Interactive Patient Education 2016 [place] [facility] WHAT IS THE  EPLEY MANEUVER? The Epley maneuver is an exercise you can do to relieve symptoms of benign paroxysmal positional vertigo (BPPV). This condition is often just referred to as vertigo. BPPV is caused by the movement of tiny crystals (canaliths) inside your inner ear. The accumulation and movement of canaliths in your inner ear causes a sudden spinning sensation  (vertigo) when you move your head to certain positions. Vertigo usually lasts about 30 seconds. BPPV usually occurs in just one ear. If you get vertigo when you lie on your left side, you probably have BPPV in your left ear. Your health care provider can tell you which ear is involved.  BPPV may be caused by a head injury. Many people older than 50 get BPPV for unknown reasons. If you have been diagnosed with BPPV, your health care provider may teach you how to do this maneuver. BPPV is not life threatening (benign) and usually goes away in time.  WHEN SHOULD I PERFORM THE EPLEY MANEUVER? You can do this maneuver at home whenever you have symptoms of vertigo. You may do the Epley maneuver up to 3 times a day until your symptoms of vertigo go away. HOW SHOULD I DO THE EPLEY MANEUVER? 1. Sit on the edge of a bed or table with your back straight. Your legs should be extended or hanging over the edge of the bed or table.  2. Turn your head halfway toward the affected ear.  3. Lie backward quickly with your head turned until you are lying flat on your back. You may want to position a pillow under your shoulders.  4. Hold this position for 30 seconds. You may experience an attack of vertigo. This is normal. Hold this position until the vertigo stops. 5. Then turn your head to the opposite direction until your unaffected ear is facing the floor.  6. Hold this position for 30 seconds. You may experience an attack of vertigo. This is normal. Hold this position until the vertigo stops. 7. Now turn your whole body to the same side as your head. Hold for another 30 seconds.  8. You can then sit back up. ARE THERE RISKS TO THIS MANEUVER? In some cases, you may have other symptoms (such as changes in your vision, weakness, or numbness). If you have these symptoms, stop doing the maneuver and call your health care provider. Even if doing these maneuvers relieves your vertigo, you may still have dizziness.  Dizziness is the sensation of light-headedness but without the sensation of movement. Even though the Epley maneuver may relieve your vertigo, it is possible that your symptoms will return within 5 years. WHAT SHOULD I DO AFTER THIS MANEUVER? After doing the Epley maneuver, you can return to your normal activities. Ask your doctor if there is anything you should do at home to prevent vertigo. This may include:  Sleeping with two or more pillows to keep your head elevated.  Not sleeping on the side of your affected ear.  Getting up slowly from bed.  Avoiding sudden movements during the day.  Avoiding extreme head movement, like looking up or bending over.  Wearing a cervical collar to prevent sudden head movements. WHAT SHOULD I DO IF MY SYMPTOMS GET WORSE? Call your health care provider if your vertigo gets worse. Call your provider right way if you have other symptoms, including:   Nausea.  Vomiting.  Headache.  Weakness.  Numbness.  Vision changes.   This information is not intended to replace advice given to  you by your health care provider. Make sure you discuss any questions you have with your health care provider.   Document Released: 05/22/2013 Document Reviewed: 05/22/2013 Elsevier Interactive Patient Education [employer].    IF you received an x-ray today, you will receive an invoice from [facility]. Please contact [facility] at [phone] with questions or concerns regarding your invoice.   IF you received labwork today, you will receive an invoice from [employer]. Please contact [facility] at [phone] with questions or concerns regarding your invoice.   Our billing staff will not be able to assist you with questions regarding bills from these companies.  You will be contacted with the lab results as soon as they are available. The fastest way to get your results is to activate your My Chart account.  Instructions are located on the last page of this paperwork. If you have not heard from [place] regarding the results in 2 weeks, please contact this office.

## 2016-04-10 NOTE — ED Triage Notes (Addendum)
Presents with Hx of TIA, sent over from [facility] to r/o stroke. Woke up today with the room spinning while lying down in bed, denies trouble speaking, slurred speech, numbness, tingling and headache. Dizziness is worse with lying flat and when looking right. Alert, oreinetd and MAE x4.  Closing eyes makes dizziness better.

## 2016-04-15 ENCOUNTER — Ambulatory Visit: Payer: BC Managed Care – PPO | Admitting: Cardiovascular Disease

## 2016-04-15 ENCOUNTER — Ambulatory Visit (INDEPENDENT_AMBULATORY_CARE_PROVIDER_SITE_OTHER): Payer: BC Managed Care – PPO | Admitting: Cardiovascular Disease

## 2016-04-15 ENCOUNTER — Encounter: Payer: Self-pay | Admitting: Cardiovascular Disease

## 2016-04-15 DIAGNOSIS — R42 Dizziness and giddiness: Secondary | ICD-10-CM | POA: Diagnosis not present

## 2016-04-15 DIAGNOSIS — E7800 Pure hypercholesterolemia, unspecified: Secondary | ICD-10-CM | POA: Diagnosis not present

## 2016-04-15 DIAGNOSIS — I10 Essential (primary) hypertension: Secondary | ICD-10-CM | POA: Diagnosis not present

## 2016-04-15 DIAGNOSIS — E785 Hyperlipidemia, unspecified: Secondary | ICD-10-CM | POA: Diagnosis not present

## 2016-04-15 DIAGNOSIS — I4510 Unspecified right bundle-branch block: Secondary | ICD-10-CM | POA: Insufficient documentation

## 2016-04-15 NOTE — Assessment & Plan Note (Addendum)
History of hypertension blood pressure measures 122/86. He is on lisinopril/hydrochlorothiazide. Continue current meds at current dosing

## 2016-04-15 NOTE — Assessment & Plan Note (Signed)
Cesar [name] was recently seen in urgent care for vertigo. This spontaneously resolved. MRA was unremarkable. He did have an MRI 2 years ago which likewise was unremarkable as well as a 2-D echocardiogram and carotid Doppler studies. I do not think that his symptoms were cardiovascular nature.

## 2016-04-15 NOTE — Assessment & Plan Note (Signed)
Recent EKG performed on 04/10/16 showed right bundle branch block. EKG 2 years ago showed an incomplete right right bundle-branch block.

## 2016-04-15 NOTE — Patient Instructions (Signed)
Medication Instructions: Your physician recommends that you continue on your current medications as directed. Please refer to the Current Medication list given to you today.   Follow-Up: Your physician recommends that you schedule a follow-up appointment as needed with Dr. Berry.  If you need a refill on your cardiac medications before your next appointment, please call your pharmacy.  

## 2016-04-15 NOTE — Progress Notes (Signed)
04/15/2016 Cesar [name]   [birth year]  [id number]  Primary Physician Cesar Agreste, MD Primary Cardiologist: Cesar Harp MD Cesar [name]  HPI:  Cesar [name] is a 60 year old mildly overweight married African-American male father of 5 children who works as a eighth Warden/ranger. He was referred by Dr. Nyoka [name] for cardiovascular evaluation because a recent episode of vertigo. His risk factors include treated hypertension and untreated hyperlipidemia. He has never had a Heart attack or stroke. He denies chest pain or shortness of breath. He did have a workup 2 years ago for vertigo with negative MRA, carotid Doppler study and 2-D echo. He saw Dr. Nyoka [name] at urgent care Cesar [name] who did an EKG that showed right bundle-branch block. He did have an incomplete left bundle-branch block in 2015. His symptoms sounded vertiginous. Do not think there is a cardiovascular etiology.   Current Outpatient Prescriptions  Medication Sig Dispense Refill  . aspirin EC 81 MG EC tablet Take 1 tablet (81 mg total) by mouth daily.    . Chlorpheniramine-Phenylephrine (SINUS & ALLERGY PO) Take 1 tablet by mouth 2 (two) times daily as needed (allergies).    . diazepam (VALIUM) 5 MG tablet Take 0.5-1 tablets (2.5-5 mg total) by mouth every 12 (twelve) hours as needed for anxiety. Or vertigo. 30 tablet 1  . fluticasone (FLONASE) 50 MCG/ACT nasal spray Place 2 sprays into both nostrils daily. 16 g 2  . lisinopril-hydrochlorothiazide (PRINZIDE,ZESTORETIC) 20-12.5 MG tablet take 1/2 tablet by mouth once daily 45 tablet 0  . meclizine (ANTIVERT) 25 MG tablet Take 1 tablet (25 mg total) by mouth 3 (three) times daily as needed for dizziness. 30 tablet 0  . NON FORMULARY Place 1 Device into the nose at bedtime. CPAP     No current facility-administered medications for this visit.     Allergies  Allergen Reactions  . Codeine Nausea And Vomiting    Social History   Social History  . Marital  status: Married    Spouse name: N/A  . Number of children: 5  . Years of education: N/A   Occupational History  . teacher    Social History Main Topics  . Smoking status: Never Smoker  . Smokeless tobacco: Never Used  . Alcohol use No  . Drug use: No  . Sexual activity: Yes   Other Topics Concern  . Not on file   Social History Narrative   Patient is right handed.   Patient drinks 1 cup caffeine daily     Review of Systems: General: negative for chills, fever, night sweats or weight changes.  Cardiovascular: negative for chest pain, dyspnea on exertion, edema, orthopnea, palpitations, paroxysmal nocturnal dyspnea or shortness of breath Dermatological: negative for rash Respiratory: negative for cough or wheezing Urologic: negative for hematuria Abdominal: negative for nausea, vomiting, diarrhea, bright red blood per rectum, melena, or hematemesis Neurologic: negative for visual changes, syncope, or dizziness All other systems reviewed and are otherwise negative except as noted above.    Blood pressure 120/70, pulse 64, height 5\' 8"  (1.727 m), weight 208 lb 12.8 oz (94.7 kg).  General appearance: alert and no distress Neck: no adenopathy, no carotid bruit, no JVD, supple, symmetrical, trachea midline and thyroid not enlarged, symmetric, no tenderness/mass/nodules Lungs: clear to auscultation bilaterally Heart: regular rate and rhythm, S1, S2 normal, no murmur, click, rub or gallop Extremities: extremities normal, atraumatic, no cyanosis or edema  EKG not performed today  ASSESSMENT AND PLAN:  Essential hypertension History of hypertension blood pressure measures 122/86. He is on lisinopril/hydrochlorothiazide. Continue current meds at current dosing  Dyslipidemia History of dyslipidemia with recent lipid profile performed 12/18/15 revealed total cholesterol 198, LDL of 139 and HDL 43. He is not on statin therapy. I will allow his primary care doctor to treat this as  needed.  DIZZINESS Mr. Cesar [name] was recently seen in urgent care for vertigo. This spontaneously resolved. MRA was unremarkable. He did have an MRI 2 years ago which likewise was unremarkable as well as a 2-D echocardiogram and carotid Doppler studies. I do not think that his symptoms were cardiovascular nature.  Right bundle branch block Recent EKG performed on 04/10/16 showed right bundle branch block. EKG 2 years ago showed an incomplete right right bundle-branch block.      Cesar Harp MD FACP,FACC,FAHA, [facility] 04/15/2016 3:34 PM

## 2016-04-15 NOTE — Assessment & Plan Note (Signed)
History of dyslipidemia with recent lipid profile performed 12/18/15 revealed total cholesterol 198, LDL of 139 and HDL 43. He is not on statin therapy. I will allow his primary care doctor to treat this as needed.

## 2016-05-10 ENCOUNTER — Other Ambulatory Visit: Payer: Self-pay | Admitting: Internal Medicine

## 2017-01-07 ENCOUNTER — Encounter: Payer: Self-pay | Admitting: Family Medicine

## 2017-01-07 ENCOUNTER — Ambulatory Visit (INDEPENDENT_AMBULATORY_CARE_PROVIDER_SITE_OTHER): Payer: BC Managed Care – PPO | Admitting: Family Medicine

## 2017-01-07 VITALS — BP 126/81 | HR 68 | Temp 98.6°F | Resp 16 | Ht 68.0 in | Wt 216.0 lb

## 2017-01-07 DIAGNOSIS — S41001A Unspecified open wound of right shoulder, initial encounter: Secondary | ICD-10-CM

## 2017-01-07 DIAGNOSIS — W57XXXA Bitten or stung by nonvenomous insect and other nonvenomous arthropods, initial encounter: Secondary | ICD-10-CM

## 2017-01-07 NOTE — Patient Instructions (Addendum)
The lesion on your shoulder appears to be a healing wound, possibly from previous tick bite, and from that area again last week. As it is improving, and appears to be healing today, I do not recommend any further incision at this time. If you notice increasing swelling, redness, or persistent bump in that area in the next 3-4 weeks, please return for repeat assessment.  Return for physical in the next month if possible and we can review health maintenance, and discuss other blood work. As we discussed it would be unlikelylikely for lyme, but can discuss those tests at that visit.   Return to the clinic or go to the nearest emergency room if any of your symptoms worsen or new symptoms occur.   Tick Bite Information, Adult Ticks are insects that draw blood for food. Most ticks live in shrubs and grassy areas. They climb onto people and animals that brush against the leaves and grasses that they rest on. Then they bite, attaching themselves to the skin. Most ticks are harmless, but some ticks carry germs that can spread to a person through a bite and cause a disease. To reduce your risk of getting a disease from a tick bite, it is important to take steps to prevent tick bites. It is also important to check for ticks after being outdoors. If you find that a tick has attached to you, watch for symptoms of disease. How can I prevent tick bites? Take these steps to help prevent tick bites when you are outdoors in an area where ticks are found:  Use insect repellent that has DEET (20% or higher), picaridin, or IR3535 in it. Use it on: ? Skin that is showing. ? The top of your boots. ? Your pant legs. ? Your sleeve cuffs.  For repellent products that contain permethrin, follow product instructions. Use these products on: ? Clothing. ? Gear. ? Boots. ? Tents.  Wear protective clothing. Long sleeves and long pants offer the best protection from ticks.  Wear light-colored clothing so you can see  ticks more easily.  Tuck your pant legs into your socks.  If you go walking on a trail, stay in the middle of the trail so your skin, hair, and clothing do not touch the bushes.  Avoid walking through areas with long grass.  Check for ticks on your clothing, hair, and skin often while you are outside, and check again before you go inside. Make sure to check the places that ticks attach themselves most often. These places include the scalp, neck, armpits, waist, groin, and joint areas. Ticks that carry a disease called Lyme disease have to be attached to the skin for 24-48 hours. Checking for ticks every day will lessen your risk of this and other diseases.  When you come indoors, wash your clothes and take a shower or a bath right away. Dry your clothes in a dryer on high heat for at least 60 minutes. This will kill any ticks in your clothes.  What is the proper way to remove a tick? If you find a tick on your body, remove it as soon as possible. Removing a tick sooner rather than later can prevent germs from passing from the tick to your body. To remove a tick that is crawling on your skin but has not bitten:  Go outdoors and brush the tick off.  Remove the tick with tape or a lint roller.  To remove a tick that is attached to your skin:  Wash  your hands.  If you have latex gloves, put them on.  Use tweezers, curved forceps, or a tick-removal tool to gently grasp the tick as close to your skin and the tick's head as possible.  Gently pull with steady, upward pressure until the tick lets go. When removing the tick: ? Take care to keep the tick's head attached to its body. ? Do not twist or jerk the tick. This can make the tick's head or mouth break off. ? Do not squeeze or crush the tick's body. This could force disease-carrying fluids from the tick into your body.  Do not try to remove a tick with heat, alcohol, petroleum jelly, or fingernail polish. Using these methods can cause the  tick to salivate and regurgitate into your bloodstream, increasing your risk of getting a disease. What should I do after removing a tick?  Clean the bite area with soap and water, rubbing alcohol, or an iodine scrub.  If an antiseptic cream or ointment is available, apply a small amount to the bite site.  Wash and disinfect any instruments that you used to remove the tick. How should I dispose of a tick? To dispose of a live tick, use one of these methods:  Place it in rubbing alcohol.  Place it in a sealed bag or container.  Wrap it tightly in tape.  Flush it down the toilet.  Contact a health care provider if:  You have symptoms of a disease after a tick bite. Symptoms of a tick-borne disease can occur from moments after the tick bites to up to 30 days after a tick is removed. Symptoms include: ? Muscle, joint, or bone pain. ? Difficulty walking or moving your legs. ? Numbness in the legs. ? Paralysis. ? Red rash around the tick bite area that is shaped like a target or a "bull's-eye." ? Redness and swelling in the area of the tick bite. ? Fever. ? Repeated vomiting. ? Diarrhea. ? Weight loss. ? Tender, swollen lymph glands. ? Shortness of breath. ? Cough. ? Pain in the abdomen. ? Headache. ? Abnormal tiredness. ? A change in your level of consciousness. ? Confusion. Get help right away if:  You are not able to remove a tick.  A part of a tick breaks off and gets stuck in your skin.  Your symptoms get worse. Summary  Ticks may carry germs that can spread to a person through a bite and cause disease.  Wear protective clothing and use insect repellent to prevent tick bites. Follow product instructions.  If you find a tick on your body, remove it as soon as possible. If the tick is attached, do not try to remove with heat, alcohol, petroleum jelly, or fingernail polish.  Remove the attached tick using tweezers, curved forceps, or a tick-removal tool. Gently pull  with steady, upward pressure until the tick lets go. Do not twist or jerk the tick. Do not squeeze or crush the tick's body.  If you have symptoms after being bitten by a tick, contact a health care provider. This information is not intended to replace advice given to you by your health care provider. Make sure you discuss any questions you have with your health care provider. Document Released: 05/14/2000 Document Revised: 02/27/2016 Document Reviewed: 02/27/2016 Elsevier Interactive Patient Education  2018 [employer].   IF you received an x-ray today, you will receive an invoice from [facility]. Please contact [facility] at [phone] with questions or concerns regarding your invoice.  IF you received labwork today, you will receive an invoice from [facility]. Please contact [facility] at [phone] with questions or concerns regarding your invoice.   Our billing staff will not be able to assist you with questions regarding bills from these companies.  You will be contacted with the lab results as soon as they are available. The fastest way to get your results is to activate your My Chart account. Instructions are located on the last page of this paperwork. If you have not heard from [place] regarding the results in 2 weeks, please contact this office.

## 2017-01-07 NOTE — Progress Notes (Addendum)
Subjective:    Patient ID: Cesar [name], male    DOB: 09-23-1955, 61 y.o.   MRN: [id number]  HPI  Cesar [name] is a 61 y.o. male  Here with a tick bite to his left posterior shoulder. Bit about 4-5 weeks ago. Unknown duration of attachment. Not that engorged, thinks may have been there a day.  Has had some itching in area when hot or sweaty. No fever. Had "24 hour flu like bug" with joint aches, chills about a week and half ago.   Resolved in 24 hrs after Nyquil. No bullseye or target lesion. No surrounding redness.   Used a hot needle to pick at area about a week ago - thinks he removed a few black dots, but though he got head out prior. Min clear fluid, no blood pr pus, then applied peroxide. Has improved since that procedure - less raised, getting smaller. Occasional hydrocortisone.   No initial HA, fever, or rash. None of those sx's currently.   No hx of lyme disease or RMSF.   Patient Active Problem List   Diagnosis Date Noted  . Hyperlipidemia 04/15/2016  . Right bundle branch block 04/15/2016  . Sleep apnea syndrome 12/29/2010  . DIZZINESS 07/15/2010  . Dyslipidemia 07/14/2010  . TESTOSTERONE DEFICIENCY 05/21/2008  . Essential hypertension 01/15/2008  . SHOULDER, PAIN 01/15/2008   Past Medical History:  Diagnosis Date  . Arthritis    "shoulders" (04/23/2014)  . Bursitis of shoulder   . HYPERTENSION 01/15/2008  . Hypertension   . OSA on CPAP   . SHOULDER, PAIN 01/15/2008  . Sinus headache    "take OTC medications prn"  . Tendonitis of shoulder    "both"  . TESTOSTERONE DEFICIENCY 05/21/2008  . TIA (transient ischemic attack) 04/23/2014   "that's what they think I've had" (04/23/2014)   Past Surgical History:  Procedure Laterality Date  . APPENDECTOMY  1978  . INGUINAL HERNIA REPAIR Left 1973  . KNEE ARTHROSCOPY WITH PATELLAR TENDON REPAIR Left 12/21/2012   Procedure: LEFT KNEE ARTHROSCOPY WITH MEDIAL and LATERALMENISECTOMY AND QUARDRICEP TENDON REPAIR;   Surgeon: Ninetta Lights, MD;  Location: [facility];  Service: Orthopedics;  Laterality: Left;  . PLANTAR FASCIA SURGERY Left 7/10   heel  . TONSILLECTOMY  1960's   Allergies  Allergen Reactions  . Codeine Nausea And Vomiting   Prior to Admission medications   Medication Sig Start Date End Date Taking? Authorizing Provider  aspirin EC 81 MG EC tablet Take 1 tablet (81 mg total) by mouth daily. 04/24/14  Yes [name], [name], MD  diazepam (VALIUM) 5 MG tablet Take 0.5-1 tablets (2.5-5 mg total) by mouth every 12 (twelve) hours as needed for anxiety. Or vertigo. 04/10/16  Yes [name], MD  fluticasone (FLONASE) 50 MCG/ACT nasal spray Place 2 sprays into both nostrils daily. 04/10/16  Yes Kirichenko, Lahoma Rocker, PA-C  lisinopril-hydrochlorothiazide (PRINZIDE,ZESTORETIC) 20-12.5 MG tablet take 1/2 tablet by mouth once daily 05/11/16  Yes Marletta Lor, MD  meclizine (ANTIVERT) 25 MG tablet Take 1 tablet (25 mg total) by mouth 3 (three) times daily as needed for dizziness. 04/10/16  Yes [name], MD  NON FORMULARY Place 1 Device into the nose at bedtime. CPAP   Yes [provider]  Chlorpheniramine-Phenylephrine (SINUS & ALLERGY PO) Take 1 tablet by mouth 2 (two) times daily as needed (allergies).    [provider]   Social History   Social History  . Marital status: Married  Spouse name: N/A  . Number of children: 5  . Years of education: N/A   Occupational History  . teacher    Social History Main Topics  . Smoking status: Never Smoker  . Smokeless tobacco: Never Used  . Alcohol use No  . Drug use: No  . Sexual activity: Yes   Other Topics Concern  . Not on file   Social History Narrative   Patient is right handed.   Patient drinks 1 cup caffeine daily    Review of Systems  Constitutional: Negative for chills and fever (only as above for 24 hrs. ).  Musculoskeletal: Negative for arthralgias.  Skin: Positive for  wound.       Objective:   Physical Exam  Constitutional: He is oriented to person, place, and time. He appears well-developed and well-nourished.  HENT:  Head: Normocephalic and atraumatic.  Cardiovascular: Normal rate.   Pulmonary/Chest: Effort normal.  Neurological: He is alert and oriented to person, place, and time.  Skin:     No other rash besides lesion on shoulder.   Vitals:   01/07/17 1546  BP: 126/81  Pulse: 68  Resp: 16  Temp: 98.6 F (37 C)  TempSrc: Oral  SpO2: 96%  Weight: 216 lb (98 kg)  Height: 5\' 8"  (1.727 m)        Assessment & Plan:   Cesar [name] is a 61 y.o. male Tick bite, initial encounter Wound of right shoulder, initial encounter  - Initial tick bite, weeks ago without surrounding redness, target lesions, headache, fever, or other systemic symptoms that would be concerning for tick borne illness. Suspected residual tick parts that may have been removed with most recent procedure at home. As it appears to be healing, no further intervention at this time.  -Plans on follow-up for physical in the next 1 month, can discuss blood testing at that time, but less likely need for tick borne illness titers.  No orders of the defined types were placed in this encounter.  Patient Instructions    The lesion on your shoulder appears to be a healing wound, possibly from previous tick bite, and from that area again last week. As it is improving, and appears to be healing today, I do not recommend any further incision at this time. If you notice increasing swelling, redness, or persistent bump in that area in the next 3-4 weeks, please return for repeat assessment.  Return for physical in the next month if possible and we can review health maintenance, and discuss other blood work. As we discussed it would be unlikelylikely for lyme, but can discuss those tests at that visit.   Return to the clinic or go to the nearest emergency room if any of your symptoms  worsen or new symptoms occur.   Tick Bite Information, Adult Ticks are insects that draw blood for food. Most ticks live in shrubs and grassy areas. They climb onto people and animals that brush against the leaves and grasses that they rest on. Then they bite, attaching themselves to the skin. Most ticks are harmless, but some ticks carry germs that can spread to a person through a bite and cause a disease. To reduce your risk of getting a disease from a tick bite, it is important to take steps to prevent tick bites. It is also important to check for ticks after being outdoors. If you find that a tick has attached to you, watch for symptoms of disease. How can I prevent tick  bites? Take these steps to help prevent tick bites when you are outdoors in an area where ticks are found:  Use insect repellent that has DEET (20% or higher), picaridin, or IR3535 in it. Use it on: ? Skin that is showing. ? The top of your boots. ? Your pant legs. ? Your sleeve cuffs.  For repellent products that contain permethrin, follow product instructions. Use these products on: ? Clothing. ? Gear. ? Boots. ? Tents.  Wear protective clothing. Long sleeves and long pants offer the best protection from ticks.  Wear light-colored clothing so you can see ticks more easily.  Tuck your pant legs into your socks.  If you go walking on a trail, stay in the middle of the trail so your skin, hair, and clothing do not touch the bushes.  Avoid walking through areas with long grass.  Check for ticks on your clothing, hair, and skin often while you are outside, and check again before you go inside. Make sure to check the places that ticks attach themselves most often. These places include the scalp, neck, armpits, waist, groin, and joint areas. Ticks that carry a disease called Lyme disease have to be attached to the skin for 24-48 hours. Checking for ticks every day will lessen your risk of this and other  diseases.  When you come indoors, wash your clothes and take a shower or a bath right away. Dry your clothes in a dryer on high heat for at least 60 minutes. This will kill any ticks in your clothes.  What is the proper way to remove a tick? If you find a tick on your body, remove it as soon as possible. Removing a tick sooner rather than later can prevent germs from passing from the tick to your body. To remove a tick that is crawling on your skin but has not bitten:  Go outdoors and brush the tick off.  Remove the tick with tape or a lint roller.  To remove a tick that is attached to your skin:  Wash your hands.  If you have latex gloves, put them on.  Use tweezers, curved forceps, or a tick-removal tool to gently grasp the tick as close to your skin and the tick's head as possible.  Gently pull with steady, upward pressure until the tick lets go. When removing the tick: ? Take care to keep the tick's head attached to its body. ? Do not twist or jerk the tick. This can make the tick's head or mouth break off. ? Do not squeeze or crush the tick's body. This could force disease-carrying fluids from the tick into your body.  Do not try to remove a tick with heat, alcohol, petroleum jelly, or fingernail polish. Using these methods can cause the tick to salivate and regurgitate into your bloodstream, increasing your risk of getting a disease. What should I do after removing a tick?  Clean the bite area with soap and water, rubbing alcohol, or an iodine scrub.  If an antiseptic cream or ointment is available, apply a small amount to the bite site.  Wash and disinfect any instruments that you used to remove the tick. How should I dispose of a tick? To dispose of a live tick, use one of these methods:  Place it in rubbing alcohol.  Place it in a sealed bag or container.  Wrap it tightly in tape.  Flush it down the toilet.  Contact a health care provider if:  You have symptoms  of a disease after a tick bite. Symptoms of a tick-borne disease can occur from moments after the tick bites to up to 30 days after a tick is removed. Symptoms include: ? Muscle, joint, or bone pain. ? Difficulty walking or moving your legs. ? Numbness in the legs. ? Paralysis. ? Red rash around the tick bite area that is shaped like a target or a "bull's-eye." ? Redness and swelling in the area of the tick bite. ? Fever. ? Repeated vomiting. ? Diarrhea. ? Weight loss. ? Tender, swollen lymph glands. ? Shortness of breath. ? Cough. ? Pain in the abdomen. ? Headache. ? Abnormal tiredness. ? A change in your level of consciousness. ? Confusion. Get help right away if:  You are not able to remove a tick.  A part of a tick breaks off and gets stuck in your skin.  Your symptoms get worse. Summary  Ticks may carry germs that can spread to a person through a bite and cause disease.  Wear protective clothing and use insect repellent to prevent tick bites. Follow product instructions.  If you find a tick on your body, remove it as soon as possible. If the tick is attached, do not try to remove with heat, alcohol, petroleum jelly, or fingernail polish.  Remove the attached tick using tweezers, curved forceps, or a tick-removal tool. Gently pull with steady, upward pressure until the tick lets go. Do not twist or jerk the tick. Do not squeeze or crush the tick's body.  If you have symptoms after being bitten by a tick, contact a health care provider. This information is not intended to replace advice given to you by your health care provider. Make sure you discuss any questions you have with your health care provider. Document Released: 05/14/2000 Document Revised: 02/27/2016 Document Reviewed: 02/27/2016 Elsevier Interactive Patient Education  2018 [employer].   IF you received an x-ray today, you will receive an invoice from [facility]. Please contact [facility]  Radiology at [phone] with questions or concerns regarding your invoice.   IF you received labwork today, you will receive an invoice from [facility]. Please contact [facility] at [phone] with questions or concerns regarding your invoice.   Our billing staff will not be able to assist you with questions regarding bills from these companies.  You will be contacted with the lab results as soon as they are available. The fastest way to get your results is to activate your My Chart account. Instructions are located on the last page of this paperwork. If you have not heard from [place] regarding the results in 2 weeks, please contact this office.      Signed,   [name], MD [facility].  01/09/17 2:30 PM

## 2017-01-20 ENCOUNTER — Ambulatory Visit (INDEPENDENT_AMBULATORY_CARE_PROVIDER_SITE_OTHER): Payer: BC Managed Care – PPO | Admitting: Family Medicine

## 2017-01-20 ENCOUNTER — Encounter: Payer: Self-pay | Admitting: Family Medicine

## 2017-01-20 VITALS — BP 136/76 | HR 66 | Temp 98.2°F | Resp 16 | Ht 68.0 in | Wt 218.0 lb

## 2017-01-20 DIAGNOSIS — Z0000 Encounter for general adult medical examination without abnormal findings: Secondary | ICD-10-CM | POA: Diagnosis not present

## 2017-01-20 DIAGNOSIS — R7303 Prediabetes: Secondary | ICD-10-CM

## 2017-01-20 DIAGNOSIS — R7989 Other specified abnormal findings of blood chemistry: Secondary | ICD-10-CM | POA: Diagnosis not present

## 2017-01-20 DIAGNOSIS — Z1159 Encounter for screening for other viral diseases: Secondary | ICD-10-CM

## 2017-01-20 DIAGNOSIS — Z23 Encounter for immunization: Secondary | ICD-10-CM | POA: Diagnosis not present

## 2017-01-20 DIAGNOSIS — Z1211 Encounter for screening for malignant neoplasm of colon: Secondary | ICD-10-CM

## 2017-01-20 DIAGNOSIS — Z13220 Encounter for screening for lipoid disorders: Secondary | ICD-10-CM

## 2017-01-20 DIAGNOSIS — I10 Essential (primary) hypertension: Secondary | ICD-10-CM | POA: Diagnosis not present

## 2017-01-20 DIAGNOSIS — Z114 Encounter for screening for human immunodeficiency virus [HIV]: Secondary | ICD-10-CM | POA: Diagnosis not present

## 2017-01-20 DIAGNOSIS — Z125 Encounter for screening for malignant neoplasm of prostate: Secondary | ICD-10-CM

## 2017-01-20 MED ORDER — ZOSTER VAC RECOMB ADJUVANTED 50 MCG/0.5ML IM SUSR: 0.5000 mL | Freq: Once | INTRAMUSCULAR | 1 refills | Status: AC

## 2017-01-20 NOTE — Patient Instructions (Addendum)
Call Dr. Vena Rua office to clarify if you are due for repeat colonoscopy as it appears you are due for repeat.   Increase exercise and watch diet for weight loss. I will check blood sugar test today.   I will recheck testosterone level today, and again low on repeating that test, then can discuss options with pharmacist to see what insurance covers.   Schedule appointment with eye care provider.   Be careful with otc cold or allergy meds. I would recommend claritin, allegra, or zyrtec once per day or flonase nasal spray as that may be more effective.    Keeping you healthy  Get these tests  Blood pressure- Have your blood pressure checked once a year by your healthcare provider.  Normal blood pressure is 120/80  Weight- Have your body mass index (BMI) calculated to screen for obesity.  BMI is a measure of body fat based on height and weight. You can also calculate your own BMI at [email or web address].  Cholesterol- Have your cholesterol checked every year.  Diabetes- Have your blood sugar checked regularly if you have high blood pressure, high cholesterol, have a family history of diabetes or if you are overweight.  Screening for Colon Cancer- Colonoscopy starting at age 67.  Screening may begin sooner depending on your family history and other health conditions. Follow up colonoscopy as directed by your Gastroenterologist.  Screening for Prostate Cancer- Both blood work (PSA) and a rectal exam help screen for Prostate Cancer.  Screening begins at age 39 with African-American men and at age 57 with Caucasian men.  Screening may begin sooner depending on your family history.  Take these medicines  Aspirin- One aspirin daily can help prevent Heart disease and Stroke.  Flu shot- Every fall.  Tetanus- Every 10 years.  Zostavax- Once after the age of 41 to prevent Shingles.  Pneumonia shot- Once after the age of 7; if you are younger than 64, ask your healthcare provider if you  need a Pneumonia shot.  Take these steps  Don't smoke- If you do smoke, talk to your doctor about quitting.  For tips on how to quit, go to www.smokefree.gov or call 1-800-QUIT-NOW.  Be physically active- Exercise 5 days a week for at least 30 minutes.  If you are not already physically active start slow and gradually work up to 30 minutes of moderate physical activity.  Examples of moderate activity include walking briskly, mowing the yard, dancing, swimming, bicycling, etc.  Eat a healthy diet- Eat a variety of healthy food such as fruits, vegetables, low fat milk, low fat cheese, yogurt, lean meant, poultry, fish, beans, tofu, etc. For more information go to www.thenutritionsource.org  Drink alcohol in moderation- Limit alcohol intake to less than two drinks a day. Never drink and drive.  Dentist- Brush and floss twice daily; visit your dentist twice a year.  Depression- Your emotional health is as important as your physical health. If you're feeling down, or losing interest in things you would normally enjoy please talk to your healthcare provider.  Eye exam- Visit your eye doctor every year.  Safe sex- If you may be exposed to a sexually transmitted infection, use a condom.  Seat belts- Seat belts can save your life; always wear one.  Smoke/Carbon Monoxide detectors- These detectors need to be installed on the appropriate level of your home.  Replace batteries at least once a year.  Skin cancer- When out in the sun, cover up and use sunscreen 15 SPF or higher.  Violence- If anyone is threatening you, please tell your healthcare provider.  Living Will/ Health care power of attorney- Speak with your healthcare provider and family.  IF you received an x-ray today, you will receive an invoice from [facility]. Please contact [facility] at [phone] with questions or concerns regarding your invoice.   IF you received labwork today, you will receive an invoice  from [facility]. Please contact [facility] at [phone] with questions or concerns regarding your invoice.   Our billing staff will not be able to assist you with questions regarding bills from these companies.  You will be contacted with the lab results as soon as they are available. The fastest way to get your results is to activate your My Chart account. Instructions are located on the last page of this paperwork. If you have not heard from [place] regarding the results in 2 weeks, please contact this office.

## 2017-01-20 NOTE — Progress Notes (Signed)
Subjective:  By signing my name below, I, Cesar [name], attest that this documentation has been prepared under the direction and in the presence of [name], MD. Electronically Signed: [name], [place]. 01/20/2017 , 3:47 PM .  Patient was seen in Room 26 .   Patient ID: Cesar [name], male    DOB: 02/20/1956, 61 y.o.   MRN: [id number] Chief Complaint  Patient presents with  . Annual Exam   HPI Cesar [name] is a 61 y.o. male Here for annual physical. He has a history of HTN, hyperlipidemia, RBBB, previous episodes of vertigo that resolved not thought to be cardiac, evaluated by cardiologist in Nov 2017. He had morning [name] tests done. 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   Cancer Screening Colonoscopy: last done in Sept 2014 by Dr. Hilarie Fredrickson; multiple polyps including tubular adenomas, plan for repeat screen in 3 years. He hasn't received a call from them recently.  Prostate cancer screening:  Lab Results  Component Value Date   PSA 0.83 12/26/2015   PSA 0.52 12/20/2014   PSA 0.58 11/21/2013   Immunizations Immunization History  Administered Date(s) Administered  . Influenza,inj,Quad PF,6+ Mos 04/24/2014  . Tdap 11/26/2013   Shingles: not received yet, but would like to receive it.   Depression Depression screen [facility] 2/9 01/20/2017 01/07/2017 04/10/2016 01/02/2016 12/31/2014  Decreased Interest 0 0 0 0 0  Down, Depressed, Hopeless 0 0 0 0 1  PHQ - 2 Score 0 0 0 0 1    Vision  Visual Acuity Screening   Right eye Left eye Both eyes  Without correction: 20/20 20/20 20/15   With correction:      He has an eye doctor, but hasn't gone in a while. He plans to call them back recently.   Dentist He sees his dentist twice a year.   Exercise He denies regular exercise. With work, he averages more than 4000 steps a day.   HTN Lab Results  Component Value Date   CREATININE 1.02 04/10/2016   He takes half tablet of lisinopril-HCTZ 20-12.5mg  QD. He denies being  on cholesterol medication.   Lab Results  Component Value Date   CHOL 198 12/26/2015   HDL 43.50 12/26/2015   LDLCALC 139 (H) 12/26/2015   LDLDIRECT 158.5 12/17/2009   TRIG 79.0 12/26/2015   CHOLHDL 5 12/26/2015   Allergic rhinitis He uses Flonase nasal spray PRN and OTC sinus and allergies treatments in the past. He usually uses OTC store brand of Actifed.   Shoulder aches He mentions shoulder pain, worsens with weather. He's had cortisone injections done, by Dr. Percell Miller.   Low Testosterone He had low testosterone of 198, checked in July 2017. He was using Androgel, but stopped the medication due to cost, as pharmacy informed him cost of $800. He declined. He states his low testosterone causes him to have trouble with erections. He has tried Viagra and Cialis in the past, but gave him headaches.   Pre-diabetes He had A1C of 5.8 in July 2017; of note, glucose was 155 in Nov 2017.   Wt Readings from Last 3 Encounters:  01/20/17 218 lb (98.9 kg)  01/07/17 216 lb (98 kg)  04/15/16 208 lb 12.8 oz (94.7 kg)   He had possible TIA episode in 2015. His neurologist states it was caused by fluctuation in his [name] sugar.   History of tick bite See office visit on Aug 10th; he did not have symptoms of tick related illness. The area  has continued to heal up with slight itching. He denies any discharge from the area, or growth.   Hep C and HIV screening: he agrees have these screening done.   Patient Active Problem List   Diagnosis Date Noted  . Hyperlipidemia 04/15/2016  . Right bundle branch block 04/15/2016  . Sleep apnea syndrome 12/29/2010  . DIZZINESS 07/15/2010  . Dyslipidemia 07/14/2010  . TESTOSTERONE DEFICIENCY 05/21/2008  . Essential hypertension 01/15/2008  . SHOULDER, PAIN 01/15/2008   Past Medical History:  Diagnosis Date  . Arthritis    "shoulders" (04/23/2014)  . Bursitis of shoulder   . HYPERTENSION 01/15/2008  . Hypertension   . OSA on CPAP   . SHOULDER, PAIN  01/15/2008  . Sinus headache    "take OTC medications prn"  . Tendonitis of shoulder    "both"  . TESTOSTERONE DEFICIENCY 05/21/2008  . TIA (transient ischemic attack) 04/23/2014   "that's what they think I've had" (04/23/2014)   Past Surgical History:  Procedure Laterality Date  . APPENDECTOMY  1978  . INGUINAL HERNIA REPAIR Left 1973  . KNEE ARTHROSCOPY WITH PATELLAR TENDON REPAIR Left 12/21/2012   Procedure: LEFT KNEE ARTHROSCOPY WITH MEDIAL and LATERALMENISECTOMY AND QUARDRICEP TENDON REPAIR;  Surgeon: Ninetta Lights, MD;  Location: [facility];  Service: Orthopedics;  Laterality: Left;  . PLANTAR FASCIA SURGERY Left 7/10   heel  . TONSILLECTOMY  1960's   Allergies  Allergen Reactions  . Codeine Nausea And Vomiting   Prior to Admission medications   Medication Sig Start Date End Date Taking? Authorizing Provider  aspirin EC 81 MG EC tablet Take 1 tablet (81 mg total) by mouth daily. 04/24/14   [name], [name], MD  Chlorpheniramine-Phenylephrine (SINUS & ALLERGY PO) Take 1 tablet by mouth 2 (two) times daily as needed (allergies).    [provider]  diazepam (VALIUM) 5 MG tablet Take 0.5-1 tablets (2.5-5 mg total) by mouth every 12 (twelve) hours as needed for anxiety. Or vertigo. 04/10/16   [name], MD  fluticasone (FLONASE) 50 MCG/ACT nasal spray Place 2 sprays into both nostrils daily. 04/10/16   Kirichenko, Lahoma Rocker, PA-C  lisinopril-hydrochlorothiazide (PRINZIDE,ZESTORETIC) 20-12.5 MG tablet take 1/2 tablet by mouth once daily 05/11/16   Marletta Lor, MD  meclizine (ANTIVERT) 25 MG tablet Take 1 tablet (25 mg total) by mouth 3 (three) times daily as needed for dizziness. 04/10/16   [name], MD  NON FORMULARY Place 1 Device into the nose at bedtime. CPAP    [provider]   Social History   Social History  . Marital status: Married    Spouse name: N/A  . Number of children: 5  . Years of education: N/A    Occupational History  . teacher    Social History Main Topics  . Smoking status: Never Smoker  . Smokeless tobacco: Never Used  . Alcohol use No  . Drug use: No  . Sexual activity: Yes   Other Topics Concern  . Not on file   Social History Narrative   Patient is right handed.   Patient drinks 1 cup caffeine daily   Review of Systems 13 point ROS - positive for postnasal drip, rhinorrhea, sinus pressure, seasonal allergies, and joint pain.      Objective:   Physical Exam  Constitutional: He is oriented to person, place, and time. He appears well-developed and well-nourished.  HENT:  Head: Normocephalic and atraumatic.  Right Ear: External ear normal.  Left Ear:  External ear normal.  Mouth/Throat: Oropharynx is clear and moist.  Eyes: Pupils are equal, round, and reactive to light. Conjunctivae and EOM are normal.  Neck: Normal range of motion. Neck supple. No thyromegaly present.  Cardiovascular: Normal rate, regular rhythm, normal heart sounds and intact distal pulses.   Pulmonary/Chest: Effort normal and breath sounds normal. No respiratory distress. He has no wheezes.  Abdominal: Soft. He exhibits no distension. There is no tenderness. Hernia confirmed negative in the right inguinal area and confirmed negative in the left inguinal area.  Genitourinary: Prostate normal.  Musculoskeletal: Normal range of motion. He exhibits no edema or tenderness.  Lymphadenopathy:    He has no cervical adenopathy.  Neurological: He is alert and oriented to person, place, and time. He has normal reflexes.  Skin: Skin is warm and dry.  Healing scar on right posterior shoulder  Psychiatric: He has a normal mood and affect. His behavior is normal.  Vitals reviewed.   Vitals:   01/20/17 1458  BP: 136/76  Pulse: 66  Resp: 16  Temp: 98.2 F (36.8 C)  TempSrc: Oral  SpO2: 96%  Weight: 218 lb (98.9 kg)  Height: 5\' 8"  (1.727 m)      Assessment & Plan:   Cesar [name] is a 61  y.o. male Annual physical exam   --anticipatory guidance as below in AVS, screening labs above. Health maintenance items as above in HPI discussed/recommended as applicable.   Screening for colon cancer - Plan: CANCELED: Ambulatory referral to Gastroenterology  - Advised to call his gastroenterologist for follow-up as appears to be due to for repeat testing  Need for shingles vaccine - Plan: Zoster Vac Recomb Adjuvanted [facility]) injection sent to pharmacy  Screening for prostate cancer - Plan: PSA  - We discussed pros and cons of prostate cancer screening, and after this discussion, he chose to have screening done. PSA obtained, and no concerning findings on DRE.   Encounter for hepatitis C screening test for low risk patient - Plan: Hepatitis C antibody  Screening for HIV (human immunodeficiency virus) - Plan: HIV antibody   Prediabetes - Plan: Comprehensive metabolic panel, Hemoglobin A1c  -Diet, exercise for weight loss discussed  Low testosterone - Plan: Testosterone, Free, Total, SHBG  -Prior low testosterone, but replacement was cost prohibitive. Likely will have other options that may be covered better by his insurance plan. Start with initial testosterone screening, probable repeat testing to verify. Can discuss options with pharmacist or call his plan to determine least costly testosterone replacement  Essential hypertension  - Overall stable. Cautioned on over-the-counter cold/allergy medicines and may need to avoid decongestants. Continue same dose of meds, diet and exercise discussed as above.  Screening for hyperlipidemia - Plan: Lipid panel   Meds ordered this encounter  Medications  . Zoster Vac Recomb Adjuvanted [facility]) injection    Sig: Inject 0.5 mLs into the muscle once. Repeat injection once in 2-6 months.    Dispense:  0.5 mL    Refill:  1   Patient Instructions   Call Dr. Vena Rua office to clarify if you are due for repeat colonoscopy as it appears you  are due for repeat.   Increase exercise and watch diet for weight loss. I will check [name] sugar test today.   I will recheck testosterone level today, and again low on repeating that test, then can discuss options with pharmacist to see what insurance covers.   Schedule appointment with eye care provider.   Be careful with  otc cold or allergy meds. I would recommend claritin, allegra, or zyrtec once per day or flonase nasal spray as that may be more effective.    Keeping you healthy  Get these tests  [name] pressure- Have your [name] pressure checked once a year by your healthcare provider.  Normal [name] pressure is 120/80  Weight- Have your body mass index (BMI) calculated to screen for obesity.  BMI is a measure of body fat based on height and weight. You can also calculate your own BMI at ViewBanking.[email or web address].  Cholesterol- Have your cholesterol checked every year.  Diabetes- Have your [name] sugar checked regularly if you have high [name] pressure, high cholesterol, have a family history of diabetes or if you are overweight.  Screening for Colon Cancer- Colonoscopy starting at age 15.  Screening may begin sooner depending on your family history and other health conditions. Follow up colonoscopy as directed by your Gastroenterologist.  Screening for Prostate Cancer- Both [name] work (PSA) and a rectal exam help screen for Prostate Cancer.  Screening begins at age 66 with African-American men and at age 54 with Caucasian men.  Screening may begin sooner depending on your family history.  Take these medicines  Aspirin- One aspirin daily can help prevent Heart disease and Stroke.  Flu shot- Every fall.  Tetanus- Every 10 years.  Zostavax- Once after the age of 35 to prevent Shingles.  Pneumonia shot- Once after the age of 14; if you are younger than 37, ask your healthcare provider if you need a Pneumonia shot.  Take these steps  Don't smoke- If you do smoke, talk to your  doctor about quitting.  For tips on how to quit, go to www.smokefree.gov or call 1-800-QUIT-NOW.  Be physically active- Exercise 5 days a week for at least 30 minutes.  If you are not already physically active start slow and gradually work up to 30 minutes of moderate physical activity.  Examples of moderate activity include walking briskly, mowing the yard, dancing, swimming, bicycling, etc.  Eat a healthy diet- Eat a variety of healthy food such as fruits, vegetables, low fat milk, low fat cheese, yogurt, lean meant, poultry, fish, beans, tofu, etc. For more information go to www.thenutritionsource.org  Drink alcohol in moderation- Limit alcohol intake to less than two drinks a day. Never drink and drive.  Dentist- Brush and floss twice daily; visit your dentist twice a year.  Depression- Your emotional health is as important as your physical health. If you're feeling down, or losing interest in things you would normally enjoy please talk to your healthcare provider.  Eye exam- Visit your eye doctor every year.  Safe sex- If you may be exposed to a sexually transmitted infection, use a condom.  Seat belts- Seat belts can save your life; always wear one.  Smoke/Carbon Monoxide detectors- These detectors need to be installed on the appropriate level of your home.  Replace batteries at least once a year.  Skin cancer- When out in the sun, cover up and use sunscreen 15 SPF or higher.  Violence- If anyone is threatening you, please tell your healthcare provider.  Living Will/ Health care power of attorney- Speak with your healthcare provider and family.  IF you received an x-ray today, you will receive an invoice from [facility]. Please contact [facility] at [phone] with questions or concerns regarding your invoice.   IF you received labwork today, you will receive an invoice from [facility]. Please contact [facility] at [phone] with questions or concerns  regarding  your invoice.   Our billing staff will not be able to assist you with questions regarding bills from these companies.  You will be contacted with the lab results as soon as they are available. The fastest way to get your results is to activate your My Chart account. Instructions are located on the last page of this paperwork. If you have not heard from [place] regarding the results in 2 weeks, please contact this office.       I personally performed the services described in this documentation, which was scribed in my presence. The recorded information has been reviewed and considered for accuracy and completeness, addended by me as needed, and agree with information above.  Signed,   [name], MD [facility] [place].  01/21/17 11:23 PM

## 2017-01-22 LAB — HEMOGLOBIN A1C
Est. average glucose Bld gHb Est-mCnc: 126 mg/dL
HEMOGLOBIN A1C: 6 % — AB (ref 4.8–5.6)

## 2017-01-22 LAB — COMPREHENSIVE METABOLIC PANEL
A/G RATIO: 1.3 (ref 1.2–2.2)
ALK PHOS: 77 IU/L (ref 39–117)
ALT: 26 IU/L (ref 0–44)
AST: 31 IU/L (ref 0–40)
Albumin: 4.1 g/dL (ref 3.6–4.8)
BUN / CREAT RATIO: 10 (ref 10–24)
BUN: 10 mg/dL (ref 8–27)
Bilirubin Total: 0.6 mg/dL (ref 0.0–1.2)
CO2: 24 mmol/L (ref 20–29)
Calcium: 9 mg/dL (ref 8.6–10.2)
Chloride: 100 mmol/L (ref 96–106)
Creatinine, Ser: 1 mg/dL (ref 0.76–1.27)
GFR calc Af Amer: 93 mL/min/{1.73_m2} (ref >59)
GFR, EST NON AFRICAN AMERICAN: 80 mL/min/{1.73_m2} (ref >59)
GLOBULIN, TOTAL: 3.2 g/dL (ref 1.5–4.5)
Glucose: 102 mg/dL — ABNORMAL HIGH (ref 65–99)
POTASSIUM: 4.4 mmol/L (ref 3.5–5.2)
SODIUM: 141 mmol/L (ref 134–144)
Total Protein: 7.3 g/dL (ref 6.0–8.5)

## 2017-01-22 LAB — LIPID PANEL
CHOL/HDL RATIO: 4.7 ratio (ref 0.0–5.0)
Cholesterol, Total: 194 mg/dL (ref 100–199)
HDL: 41 mg/dL (ref >39)
LDL Calculated: 137 mg/dL — ABNORMAL HIGH (ref 0–99)
TRIGLYCERIDES: 81 mg/dL (ref 0–149)
VLDL Cholesterol Cal: 16 mg/dL (ref 5–40)

## 2017-01-22 LAB — TESTOSTERONE, FREE, TOTAL, SHBG
Sex Hormone Binding: 26.3 nmol/L (ref 19.3–76.4)
TESTOSTERONE FREE: 4.2 pg/mL — AB (ref 6.6–18.1)
TESTOSTERONE: 168 ng/dL — AB (ref 264–916)

## 2017-01-22 LAB — HIV ANTIBODY (ROUTINE TESTING W REFLEX): HIV SCREEN 4TH GENERATION: NONREACTIVE

## 2017-01-22 LAB — HEPATITIS C ANTIBODY: Hep C Virus Ab: <0.1 s/co ratio (ref 0.0–0.9)

## 2017-01-22 LAB — PSA: Prostate Specific Ag, Serum: 0.6 ng/mL (ref 0.0–4.0)

## 2017-04-11 ENCOUNTER — Ambulatory Visit (INDEPENDENT_AMBULATORY_CARE_PROVIDER_SITE_OTHER): Payer: BC Managed Care – PPO | Admitting: Family Medicine

## 2017-04-11 ENCOUNTER — Encounter: Payer: Self-pay | Admitting: Family Medicine

## 2017-04-11 VITALS — BP 146/90 | HR 79 | Resp 16 | Ht 68.0 in | Wt 216.8 lb

## 2017-04-11 DIAGNOSIS — R7989 Other specified abnormal findings of blood chemistry: Secondary | ICD-10-CM

## 2017-04-11 DIAGNOSIS — M65312 Trigger thumb, left thumb: Secondary | ICD-10-CM

## 2017-04-11 NOTE — Progress Notes (Signed)
Subjective:  By signing my name below, I, [name], attest that this documentation has been prepared under the direction and in the presence of [name], MD. Electronically Signed: [name], [place]. 04/11/2017 , 3:02 PM .  Patient was seen in Room 8 .   Patient ID: Cesar [name], male    DOB: 28-Nov-1955, 61 y.o.   MRN: [id number] Chief Complaint  Patient presents with  . THUMB PAIN    patient c/o pain and swelling to thumb on L hand x 1 month. Patient denies injury.   HPI Cesar [name] is a 61 y.o. male Patient complains of left thumb pain and swelling for a month. Patient states his left thumb pops and can't really bend it down. He denies any known injuries. He does note remembering carrying something heavy and pushing into the area prior to this pain about a month ago. He denies any pain from carrying the heavy object. He bought an OTC thumb brace but was still able to move his thumb. He's also tried applying ice over the area.   He is right hand dominant. He mentions being seen at [facility] for his shoulders and was requesting to be referred there for his thumb pain.   He also states still waiting on a call for his colonoscopy.   Patient Active Problem List   Diagnosis Date Noted  . Hyperlipidemia 04/15/2016  . Right bundle branch block 04/15/2016  . Sleep apnea syndrome 12/29/2010  . DIZZINESS 07/15/2010  . Dyslipidemia 07/14/2010  . TESTOSTERONE DEFICIENCY 05/21/2008  . Essential hypertension 01/15/2008  . SHOULDER, PAIN 01/15/2008   Past Medical History:  Diagnosis Date  . Arthritis    "shoulders" (04/23/2014)  . Bursitis of shoulder   . HYPERTENSION 01/15/2008  . Hypertension   . OSA on CPAP   . SHOULDER, PAIN 01/15/2008  . Sinus headache    "take OTC medications prn"  . Tendonitis of shoulder    "both"  . TESTOSTERONE DEFICIENCY 05/21/2008  . TIA (transient ischemic attack) 04/23/2014   "that's what they think I've had" (04/23/2014)    Past Surgical History:  Procedure Laterality Date  . APPENDECTOMY  1978  . INGUINAL HERNIA REPAIR Left 1973  . PLANTAR FASCIA SURGERY Left 7/10   heel  . TONSILLECTOMY  1960's   Allergies  Allergen Reactions  . Codeine Nausea And Vomiting   Prior to Admission medications   Medication Sig Start Date End Date Taking? Authorizing Provider  aspirin EC 81 MG EC tablet Take 1 tablet (81 mg total) by mouth daily. 04/24/14   [name], [name], MD  Chlorpheniramine-Phenylephrine (SINUS & ALLERGY PO) Take 1 tablet by mouth 2 (two) times daily as needed (allergies).    [provider]  diazepam (VALIUM) 5 MG tablet Take 0.5-1 tablets (2.5-5 mg total) by mouth every 12 (twelve) hours as needed for anxiety. Or vertigo. 04/10/16   [name], MD  fluticasone (FLONASE) 50 MCG/ACT nasal spray Place 2 sprays into both nostrils daily. 04/10/16   Kirichenko, Lahoma Rocker, PA-C  lisinopril-hydrochlorothiazide (PRINZIDE,ZESTORETIC) 20-12.5 MG tablet take 1/2 tablet by mouth once daily 05/11/16   Marletta Lor, MD  meclizine (ANTIVERT) 25 MG tablet Take 1 tablet (25 mg total) by mouth 3 (three) times daily as needed for dizziness. 04/10/16   [name], MD  NON FORMULARY Place 1 Device into the nose at bedtime. CPAP    [provider]   Social History   Socioeconomic History  . Marital  status: Married    Spouse name: Not on file  . Number of children: 5  . Years of education: Not on file  . Highest education level: Not on file  Social Needs  . Financial resource strain: Not on file  . Food insecurity - worry: Not on file  . Food insecurity - inability: Not on file  . Transportation needs - medical: Not on file  . Transportation needs - non-medical: Not on file  Occupational History  . Occupation: Pharmacist, hospital  Tobacco Use  . Smoking status: Never Smoker  . Smokeless tobacco: Never Used  Substance and Sexual Activity  . Alcohol use: No  . Drug use: No  . Sexual  activity: Yes  Other Topics Concern  . Not on file  Social History Narrative   Patient is right handed.   Patient drinks 1 cup caffeine daily   Review of Systems  Constitutional: Negative for fatigue and unexpected weight change.  Eyes: Negative for visual disturbance.  Respiratory: Negative for cough, chest tightness and shortness of breath.   Cardiovascular: Negative for chest pain, palpitations and leg swelling.  Gastrointestinal: Negative for abdominal pain and blood in stool.  Musculoskeletal: Positive for arthralgias and joint swelling.  Skin: Negative for rash and wound.  Neurological: Negative for dizziness, weakness, light-headedness, numbness and headaches.       Objective:   Physical Exam  Constitutional: He is oriented to person, place, and time. He appears well-developed and well-nourished. No distress.  HENT:  Head: Normocephalic and atraumatic.  Eyes: EOM are normal. Pupils are equal, round, and reactive to light.  Neck: Neck supple.  Cardiovascular: Normal rate.  Pulmonary/Chest: Effort normal. No respiratory distress.  Musculoskeletal: Normal range of motion.  Left hand: crepitus and triggering at the IP of the left thumb as well as the volar aspect and the distal thenar eminence  Neurological: He is alert and oriented to person, place, and time.  Skin: Skin is warm and dry.  Psychiatric: He has a normal mood and affect. His behavior is normal.  Nursing note and vitals reviewed.   Vitals:   04/11/17 1442  BP: (!) 146/90  Pulse: 79  Resp: 16  SpO2: 99%  Weight: 216 lb 12.8 oz (98.3 kg)  Height: 5\' 8"  (1.727 m)      Assessment & Plan:   Cesar [name] is a 61 y.o. male Trigger thumb, left thumb - Plan: Ambulatory referral to Orthopedic Surgery, Thumb spica  - Appears to be trigger thumb, will attempt initial brace to decrease use with range of motion of wrist and thumb few times per day. Occasional NSAID up to 2 weeks, and we'll refer to orthopedics as  may need injection. Handout given  Low testosterone in male - Plan: Testosterone  -Plan to return for lab only visit for testosterone level between 8 and 10 the morning. Recommended he discuss with pharmacist what may be the least costly for his insurance plan.  No orders of the defined types were placed in this encounter.  Patient Instructions  Check your Mychart account for info on labs.   Check with pharmacist about coverage for testosterone replacement, but return for lab only visit between 8 and 10 am.   Wear brace for thumb during the day - out of brace 1-2 times per day from range of motion of hand, thumb. I will refer you to ortho for possible injection. Occasional ibuprofen or alleve as needed for up to 2 weeks - watch your blood pressure  if taking NSAIDS.   If blood pressure is elevated - please return to discuss your medications.   Call Dr. Hilarie Fredrickson to schedule colonoscopy. [phone]   Trigger Finger Trigger finger (stenosing tenosynovitis) is a condition that causes a finger to get stuck in a bent position. Each finger has a tough, cord-like tissue that connects muscle to bone (tendon), and each tendon is surrounded by a tunnel of tissue (tendon sheath). To move your finger, your tendon needs to slide freely through the sheath. Trigger finger happens when the tendon or the sheath thickens, making it difficult to move your finger. Trigger finger can affect any finger or a thumb. It may affect more than one finger. Mild cases may clear up with rest and medicine. Severe cases require more treatment. What are the causes? Trigger finger is caused by a thickened finger tendon or tendon sheath. The cause of this thickening is not known. What increases the risk? The following factors may make you more likely to develop this condition:  Doing activities that require a strong grip.  Having rheumatoid arthritis, gout, or diabetes.  Being 80-56 years old.  Being a woman.  What are the  signs or symptoms? Symptoms of this condition include:  Pain when bending or straightening your finger.  Tenderness or swelling where your finger attaches to the palm of your hand.  A lump in the palm of your hand or on the inside of your finger.  Hearing a popping sound when you try to straighten your finger.  Feeling a popping, catching, or locking sensation when you try to straighten your finger.  Being unable to straighten your finger.  How is this diagnosed? This condition is diagnosed based on your symptoms and a physical exam. How is this treated? This condition may be treated by:  Resting your finger and avoiding activities that make symptoms worse.  Wearing a finger splint to keep your finger in a slightly bent position.  Taking NSAIDs to relieve pain and swelling.  Injecting medicine (steroids) into the tendon sheath to reduce swelling and irritation. Injections may need to be repeated.  Having surgery to open the tendon sheath. This may be done if other treatments do not work and you cannot straighten your finger. You may need physical therapy after surgery.  Follow these instructions at home:  Use moist heat to help reduce pain and swelling as told by your health care provider.  Rest your finger and avoid activities that make pain worse. Return to normal activities as told by your health care provider.  If you have a splint, wear it as told by your health care provider.  Take over-the-counter and prescription medicines only as told by your health care provider.  Keep all follow-up visits as told by your health care provider. This is important. Contact a health care provider if:  Your symptoms are not improving with home care. Summary  Trigger finger (stenosing tenosynovitis) causes your finger to get stuck in a bent position, and it can make it difficult and painful to straighten your finger.  This condition develops when a finger tendon or tendon sheath  thickens.  Treatment starts with resting, wearing a splint, and taking NSAIDs.  In severe cases, surgery to open the tendon sheath may be needed. This information is not intended to replace advice given to you by your health care provider. Make sure you discuss any questions you have with your health care provider. Document Released: 03/06/2004 Document Revised: 04/27/2016 Document Reviewed: 04/27/2016 Elsevier  Interactive Patient Education  [employer].   I personally performed the services described in this documentation, which was scribed in my presence. The recorded information has been reviewed and considered for accuracy and completeness, addended by me as needed, and agree with information above.  Signed,   [name], MD [facility] [place].  04/13/17 9:50 PM

## 2017-04-11 NOTE — Patient Instructions (Addendum)
Check your Mychart account for info on labs.   Check with pharmacist about coverage for testosterone replacement, but return for lab only visit between 8 and 10 am.   Wear brace for thumb during the day - out of brace 1-2 times per day from range of motion of hand, thumb. I will refer you to ortho for possible injection. Occasional ibuprofen or alleve as needed for up to 2 weeks - watch your blood pressure if taking NSAIDS.   If blood pressure is elevated - please return to discuss your medications.   Call Dr. Hilarie Fredrickson to schedule colonoscopy. [phone]   Trigger Finger Trigger finger (stenosing tenosynovitis) is a condition that causes a finger to get stuck in a bent position. Each finger has a tough, cord-like tissue that connects muscle to bone (tendon), and each tendon is surrounded by a tunnel of tissue (tendon sheath). To move your finger, your tendon needs to slide freely through the sheath. Trigger finger happens when the tendon or the sheath thickens, making it difficult to move your finger. Trigger finger can affect any finger or a thumb. It may affect more than one finger. Mild cases may clear up with rest and medicine. Severe cases require more treatment. What are the causes? Trigger finger is caused by a thickened finger tendon or tendon sheath. The cause of this thickening is not known. What increases the risk? The following factors may make you more likely to develop this condition:  Doing activities that require a strong grip.  Having rheumatoid arthritis, gout, or diabetes.  Being 3-19 years old.  Being a woman.  What are the signs or symptoms? Symptoms of this condition include:  Pain when bending or straightening your finger.  Tenderness or swelling where your finger attaches to the palm of your hand.  A lump in the palm of your hand or on the inside of your finger.  Hearing a popping sound when you try to straighten your finger.  Feeling a popping, catching, or  locking sensation when you try to straighten your finger.  Being unable to straighten your finger.  How is this diagnosed? This condition is diagnosed based on your symptoms and a physical exam. How is this treated? This condition may be treated by:  Resting your finger and avoiding activities that make symptoms worse.  Wearing a finger splint to keep your finger in a slightly bent position.  Taking NSAIDs to relieve pain and swelling.  Injecting medicine (steroids) into the tendon sheath to reduce swelling and irritation. Injections may need to be repeated.  Having surgery to open the tendon sheath. This may be done if other treatments do not work and you cannot straighten your finger. You may need physical therapy after surgery.  Follow these instructions at home:  Use moist heat to help reduce pain and swelling as told by your health care provider.  Rest your finger and avoid activities that make pain worse. Return to normal activities as told by your health care provider.  If you have a splint, wear it as told by your health care provider.  Take over-the-counter and prescription medicines only as told by your health care provider.  Keep all follow-up visits as told by your health care provider. This is important. Contact a health care provider if:  Your symptoms are not improving with home care. Summary  Trigger finger (stenosing tenosynovitis) causes your finger to get stuck in a bent position, and it can make it difficult and painful to straighten  your finger.  This condition develops when a finger tendon or tendon sheath thickens.  Treatment starts with resting, wearing a splint, and taking NSAIDs.  In severe cases, surgery to open the tendon sheath may be needed. This information is not intended to replace advice given to you by your health care provider. Make sure you discuss any questions you have with your health care provider. Document Released: 03/06/2004  Document Revised: 04/27/2016 Document Reviewed: 04/27/2016 Elsevier Interactive Patient Education  2017 [employer].

## 2017-04-13 ENCOUNTER — Encounter: Payer: Self-pay | Admitting: Family Medicine

## 2017-06-10 ENCOUNTER — Other Ambulatory Visit: Payer: Self-pay | Admitting: *Deleted

## 2017-06-10 ENCOUNTER — Telehealth: Payer: Self-pay | Admitting: Family Medicine

## 2017-06-10 MED ORDER — LISINOPRIL-HYDROCHLOROTHIAZIDE 20-12.5 MG PO TABS: 0.5000 | ORAL_TABLET | Freq: Every day | ORAL | 1 refills | Status: DC

## 2017-06-10 NOTE — Telephone Encounter (Signed)
Copied from [place] [id number]. Topic: Quick Communication - See Telephone Encounter >> Jun 10, 2017  8:45 AM Ether Griffins B wrote: CRM for notification. See Telephone encounter for:  Pt needing refill on lisinopril  06/10/17.

## 2017-07-06 ENCOUNTER — Other Ambulatory Visit: Payer: Self-pay | Admitting: Orthopedic Surgery

## 2017-07-06 DIAGNOSIS — M25511 Pain in right shoulder: Secondary | ICD-10-CM

## 2017-07-13 ENCOUNTER — Ambulatory Visit
Admission: RE | Admit: 2017-07-13 | Discharge: 2017-07-13 | Disposition: A | Discharge status: Home / Self Care | Payer: BC Managed Care – PPO | Source: Ambulatory Visit | Attending: Orthopedic Surgery | Admitting: Orthopedic Surgery

## 2017-07-13 DIAGNOSIS — M25511 Pain in right shoulder: Secondary | ICD-10-CM

## 2017-10-27 IMAGING — MR MR HEAD W/O CM
9 of 10 series · 36 of 48 positions shown · non-contrast
Comparison: MRI 04/23/2014

CLINICAL DATA: Vertigo

EXAM:
MRI HEAD WITHOUT CONTRAST
TECHNIQUE: Multiplanar, multiecho pulse sequences of the brain and surrounding
structures were obtained without intravenous contrast.

[Series 2: FLAIR · sagittal · 5.0mm · 0.47mm/px · 2 of 25 slices shown (1 of 2)]
[im 1/25]
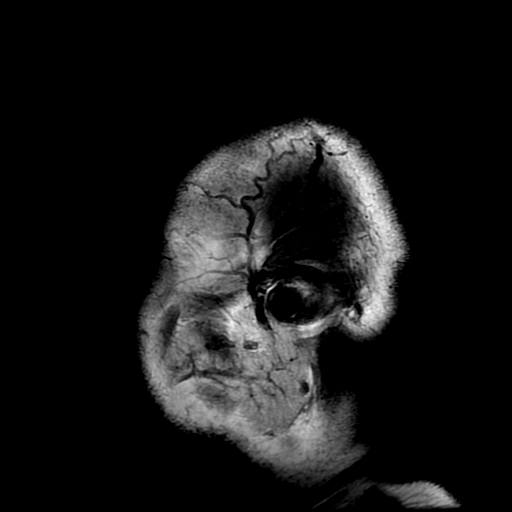
[im 25/25]
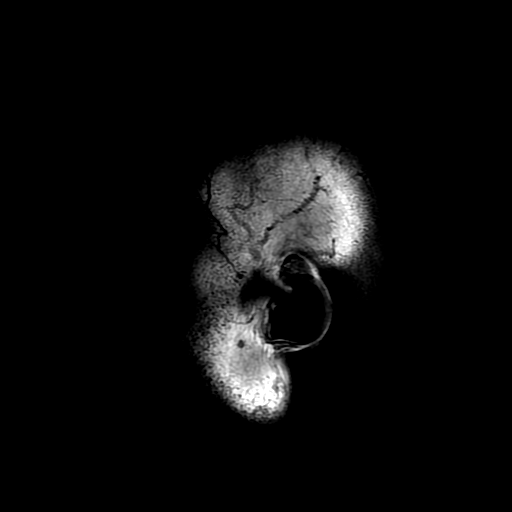

[Series 4: DWI · axial · 3.0mm · 0.94mm/px · z∈[-74,+81]mm · 9 of 106 slices shown (1 of 2)]
[im 1/106]
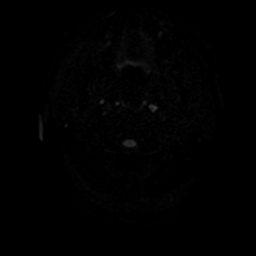
[im 14/106]
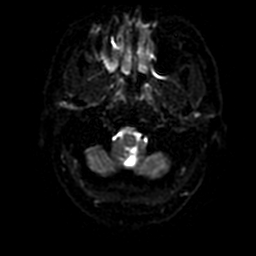
[im 27/106]
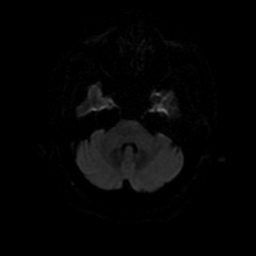
[im 40/106]
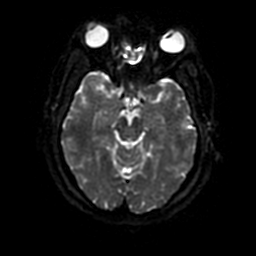
[im 53/106]
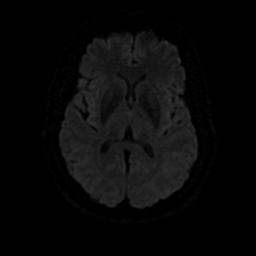
[im 66/106]
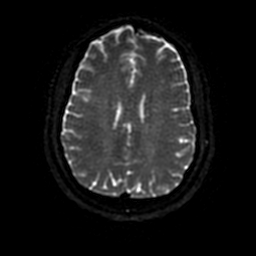
[im 79/106]
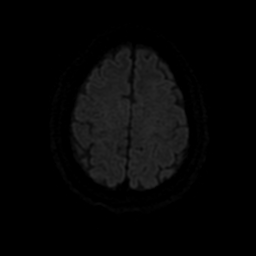
[im 92/106]
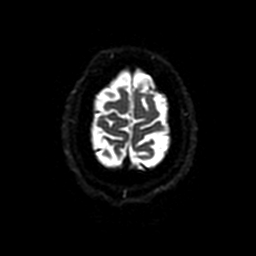
[im 106/106]
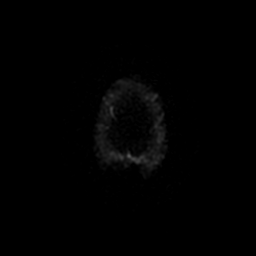

[Series 5: T2 · axial · 5.0mm · 0.47mm/px · z∈[-74,+81]mm · 2 of 27 slices shown (1 of 2)]
[im 1/27]
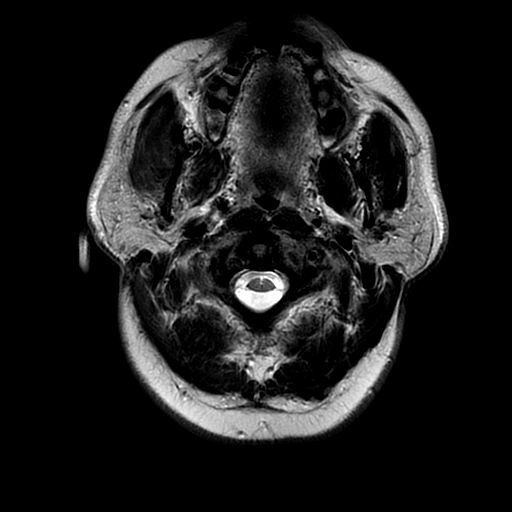
[im 27/27]
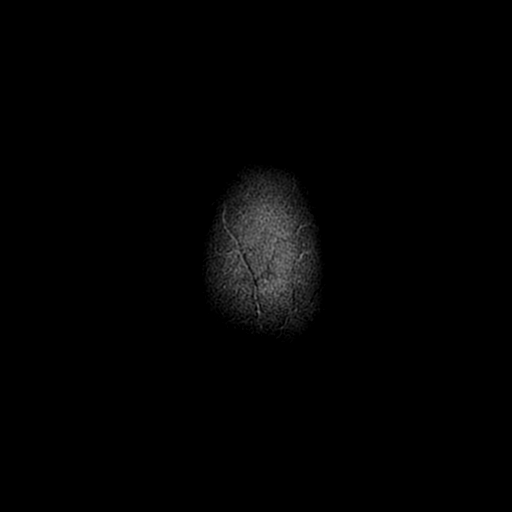

[Series 6: FLAIR · axial · 5.0mm · 0.47mm/px · z∈[-74,+81]mm · 2 of 27 slices shown (2 of 2)]
[im 1/27]
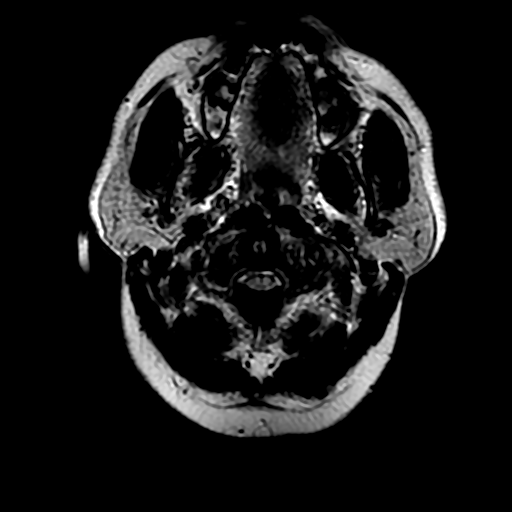
[im 27/27]
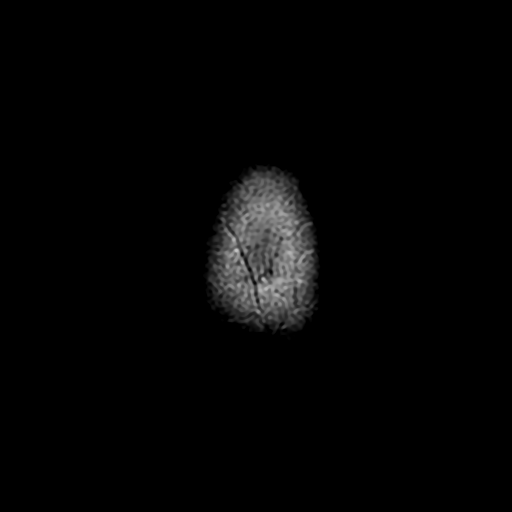

[Series 7: DWI · coronal · 4.0mm · 0.94mm/px · 7 of 76 slices shown (2 of 2)]
[im 1/76]
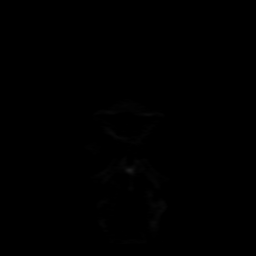
[im 13/76]
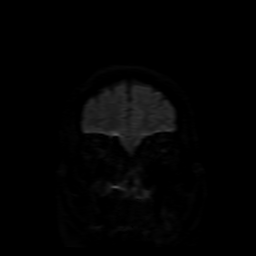
[im 26/76]
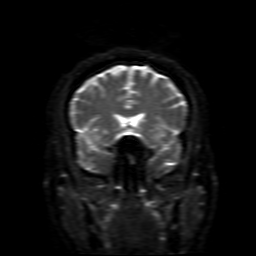
[im 38/76]
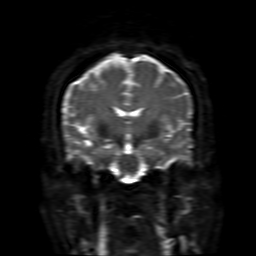
[im 51/76]
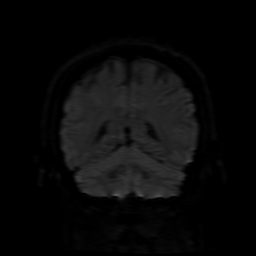
[im 63/76]
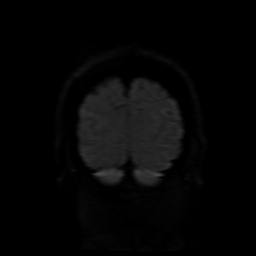
[im 76/76]
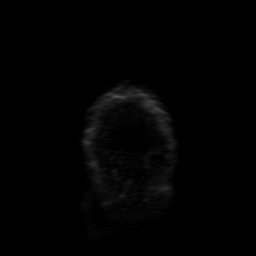

[Series 8: (person_name) · axial · 3.0mm · 0.47mm/px · z∈[-76,-23]mm · 3 of 108 slices shown]
[im 1/108]
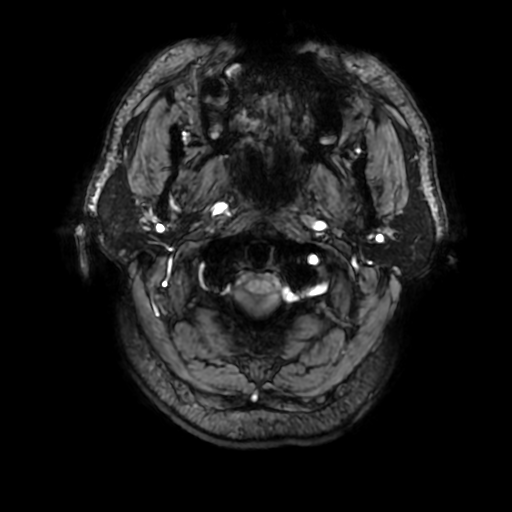
[im 12/108]
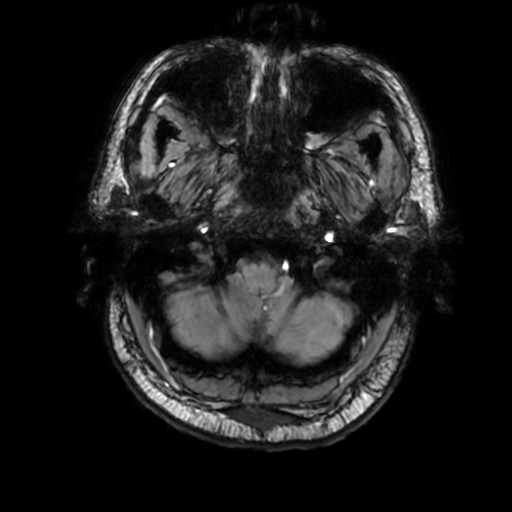
[im 36/108]
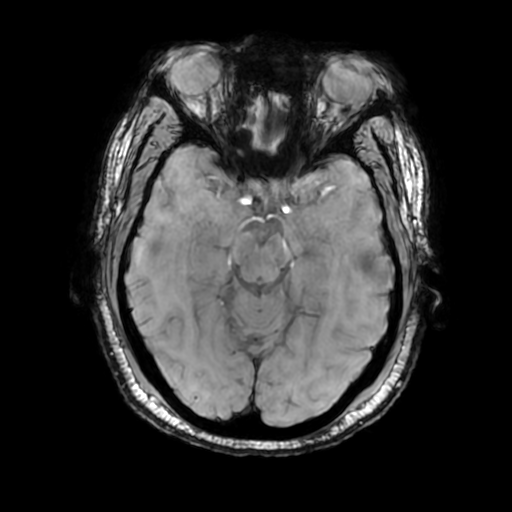

[Series 9: T2 · coronal · 5.0mm · 0.39mm/px · 3 of 32 slices shown (2 of 2)]
[im 1/32]
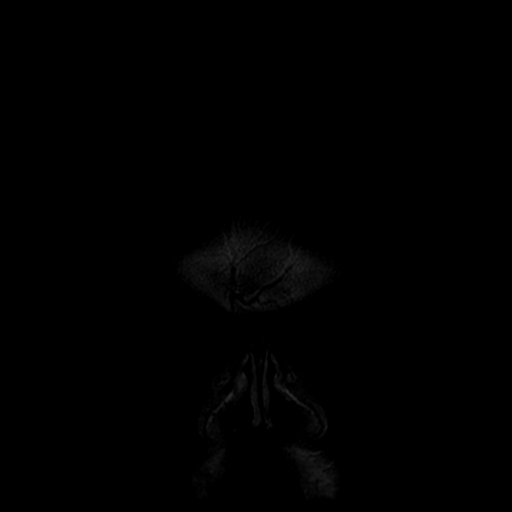
[im 16/32]
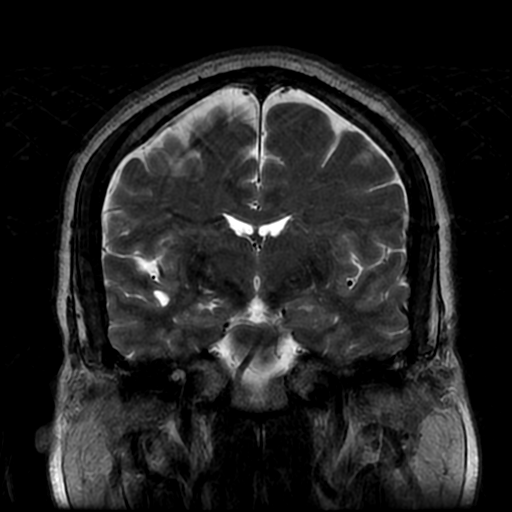
[im 32/32]
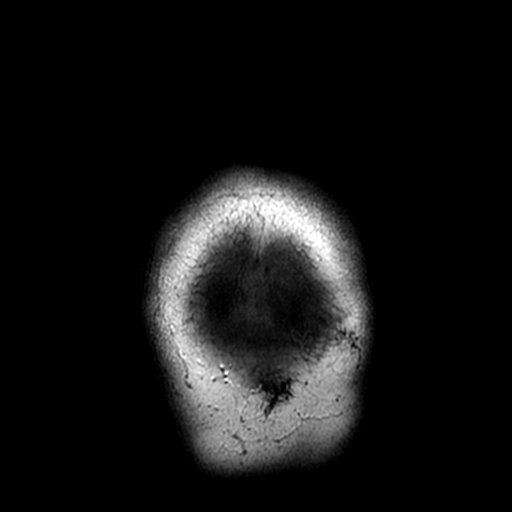

[Series 450: ADC · axial · 3.0mm · 0.94mm/px · z∈[-74,+81]mm · 5 of 52 slices shown (1 of 2)]
[im 1/52]
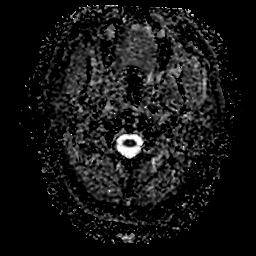
[im 13/52]
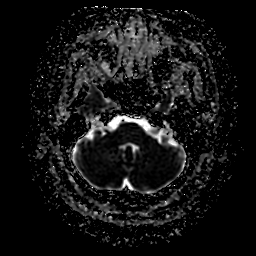
[im 26/52]
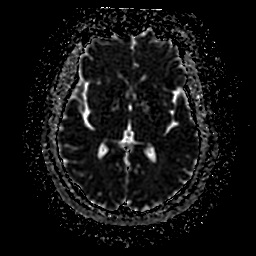
[im 39/52]
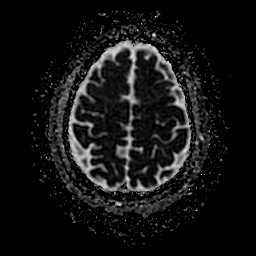
[im 52/52]
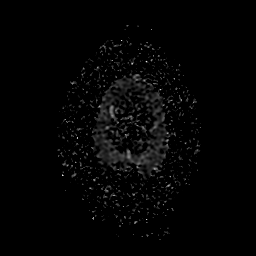

[Series 750: ADC · coronal · 4.0mm · 0.94mm/px · 3 of 38 slices shown (2 of 2)]
[im 1/38]
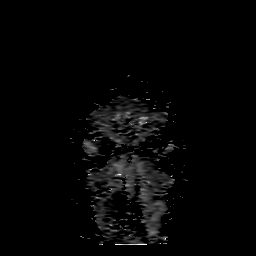
[im 19/38]
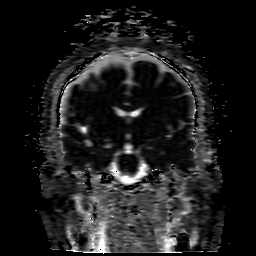
[im 38/38]
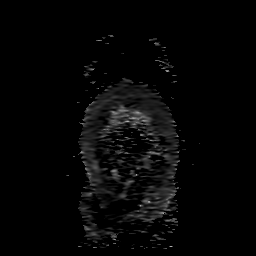

[36 of 48 positions shown; findings below may reference images not displayed]

FINDINGS: Brain: Negative for acute infarct. Several small white matter
hyperintensities which are chronic and unchanged.

Ventricle size normal. Cerebral volume normal. Empty sella unchanged
from the prior study. Negative for hemorrhage or mass lesion.

Vascular: Normal arterial flow voids.

Skull and upper cervical spine: Negative

Sinuses/Orbits: Extensive mucosal edema in the paranasal sinuses
with air-fluid levels in the maxillary sinus bilaterally. Normal
orbit.

Other: None
IMPRESSION: No acute intracranial abnormality. Mild chronic white matter changes
similar to the prior study

Sinusitis with air-fluid levels.

## 2017-10-29 ENCOUNTER — Telehealth: Payer: Self-pay | Admitting: Family Medicine

## 2017-10-29 NOTE — Telephone Encounter (Signed)
Pt. Calling to request refill on lisinopril to be sent to usual pharmacy to last until visit with Dr Carlota Raspberry on 11/03/17

## 2017-10-31 NOTE — Telephone Encounter (Signed)
Refill denied due to failed f/u

## 2017-11-03 ENCOUNTER — Other Ambulatory Visit: Payer: Self-pay

## 2017-11-03 ENCOUNTER — Ambulatory Visit: Payer: BC Managed Care – PPO | Admitting: Family Medicine

## 2017-11-03 ENCOUNTER — Encounter: Payer: Self-pay | Admitting: Family Medicine

## 2017-11-03 VITALS — BP 140/88 | HR 82 | Temp 98.5°F | Resp 18 | Ht 68.0 in | Wt 210.6 lb

## 2017-11-03 DIAGNOSIS — E785 Hyperlipidemia, unspecified: Secondary | ICD-10-CM

## 2017-11-03 DIAGNOSIS — I10 Essential (primary) hypertension: Secondary | ICD-10-CM | POA: Diagnosis not present

## 2017-11-03 DIAGNOSIS — R7303 Prediabetes: Secondary | ICD-10-CM | POA: Diagnosis not present

## 2017-11-03 DIAGNOSIS — E291 Testicular hypofunction: Secondary | ICD-10-CM | POA: Diagnosis not present

## 2017-11-03 MED ORDER — LISINOPRIL-HYDROCHLOROTHIAZIDE 20-12.5 MG PO TABS: 0.5000 | ORAL_TABLET | Freq: Every day | ORAL | 1 refills | Status: DC

## 2017-11-03 NOTE — Progress Notes (Signed)
Subjective:    Patient ID: Cesar [name], male    DOB: Aug 05, 1955, 62 y.o.   MRN: [id number]  HPI Cesar [name] is a 62 y.o. male Presents today for: Chief Complaint  Patient presents with  . Medication Refill    BP med check    Here for follow-up.  Hypertension: BP Readings from Last 3 Encounters:  11/03/17 140/88  04/11/17 (!) 146/90  01/20/17 136/76   Lab Results  Component Value Date   CREATININE 1.00 01/20/2017  He takes lisinopril HCTZ 20/12.5 mg, half pill daily. Home readings 144/90, but out of meds for past 2 weeks. Has treid hone with apple cider vinegar daily. Did feel like blood pressure was running borderline high on meds, but thinks vinegar is helping. Was having occasional lightheadedness at times once per week when on meds.  Compliant with CPAP for OSA.  Hyperlipidemia: Elevated LDL in August 2018.  Recommended initial approach of diet and exercise with repeat testing.  He has lost 8 pounds since that time. not fasting - had oatmeal cookies 2 hours ago. Plans to return fasting next few days.  Lab Results  Component Value Date   CHOL 194 01/20/2017   HDL 41 01/20/2017   LDLCALC 137 (H) 01/20/2017   LDLDIRECT 158.5 12/17/2009   TRIG 81 01/20/2017   CHOLHDL 4.7 01/20/2017  The 10-year ASCVD risk score [name] DC Jr., et al., 2013) is: 17.7%   Values used to calculate the score:     Age: 41 years     Sex: Male     Is Non-Hispanic African American: Yes     Diabetic: No     Tobacco smoker: No     Systolic Blood Pressure: 527 mmHg     Is BP treated: Yes     HDL Cholesterol: 41 mg/dL     Total Cholesterol: 194 mg/dL  Lab Results  Component Value Date   ALT 26 01/20/2017   AST 31 01/20/2017   ALKPHOS 77 01/20/2017   BILITOT 0.6 01/20/2017    Prediabetes: Lab Results  Component Value Date   HGBA1C 6.0 (H) 01/20/2017   Wt Readings from Last 3 Encounters:  11/03/17 210 lb 9.6 oz (95.5 kg)  04/11/17 216 lb 12.8 oz (98.3 kg)  01/20/17 218 lb (98.9  kg)  no fast food, but is drinking sweet tea. No regular exercise.  Body mass index is 32.02 kg/m.  History of low testosterone.  Testosterone level of 168 in August 2018, slightly lower than previous reading of 198.  Recommended repeat testing to verify low reading then discuss treatment options.  Has not had repeat testing since August 2018.   Patient Active Problem List   Diagnosis Date Noted  . Hyperlipidemia 04/15/2016  . Right bundle branch block 04/15/2016  . Sleep apnea syndrome 12/29/2010  . DIZZINESS 07/15/2010  . Dyslipidemia 07/14/2010  . TESTOSTERONE DEFICIENCY 05/21/2008  . Essential hypertension 01/15/2008  . SHOULDER, PAIN 01/15/2008   Past Medical History:  Diagnosis Date  . Arthritis    "shoulders" (04/23/2014)  . Bursitis of shoulder   . HYPERTENSION 01/15/2008  . Hypertension   . OSA on CPAP   . SHOULDER, PAIN 01/15/2008  . Sinus headache    "take OTC medications prn"  . Tendonitis of shoulder    "both"  . TESTOSTERONE DEFICIENCY 05/21/2008  . TIA (transient ischemic attack) 04/23/2014   "that's what they think I've had" (04/23/2014)   Past Surgical History:  Procedure Laterality Date  .  APPENDECTOMY  1978  . INGUINAL HERNIA REPAIR Left 1973  . KNEE ARTHROSCOPY WITH PATELLAR TENDON REPAIR Left 12/21/2012   Procedure: LEFT KNEE ARTHROSCOPY WITH MEDIAL and LATERALMENISECTOMY AND QUARDRICEP TENDON REPAIR;  Surgeon: Ninetta Lights, MD;  Location: [facility];  Service: Orthopedics;  Laterality: Left;  . PLANTAR FASCIA SURGERY Left 7/10   heel  . TONSILLECTOMY  1960's   Allergies  Allergen Reactions  . Codeine Nausea And Vomiting   Prior to Admission medications   Medication Sig Start Date End Date Taking? Authorizing Provider  aspirin EC 81 MG EC tablet Take 1 tablet (81 mg total) by mouth daily. 04/24/14  Yes [name], [name], MD  Chlorpheniramine-Phenylephrine (SINUS & ALLERGY PO) Take 1 tablet by mouth 2 (two) times daily as needed  (allergies).   Yes [provider]  diazepam (VALIUM) 5 MG tablet Take 0.5-1 tablets (2.5-5 mg total) by mouth every 12 (twelve) hours as needed for anxiety. Or vertigo. 04/10/16  Yes [name], MD  fluticasone (FLONASE) 50 MCG/ACT nasal spray Place 2 sprays into both nostrils daily. 04/10/16  Yes Kirichenko, Tatyana, PA-C  lisinopril-hydrochlorothiazide (PRINZIDE,ZESTORETIC) 20-12.5 MG tablet Take 0.5 tablets by mouth daily. 06/10/17  Yes [name], MD  meclizine (ANTIVERT) 25 MG tablet Take 1 tablet (25 mg total) by mouth 3 (three) times daily as needed for dizziness. 04/10/16  Yes [name], MD  NON FORMULARY Place 1 Device into the nose at bedtime. CPAP   Yes [provider]   Social History   Socioeconomic History  . Marital status: Married    Spouse name: Not on file  . Number of children: 5  . Years of education: Not on file  . Highest education level: Not on file  Occupational History  . Occupation: Pharmacist, hospital  Social Needs  . Financial resource strain: Not on file  . Food insecurity:    Worry: Not on file    Inability: Not on file  . Transportation needs:    Medical: Not on file    Non-medical: Not on file  Tobacco Use  . Smoking status: Never Smoker  . Smokeless tobacco: Never Used  Substance and Sexual Activity  . Alcohol use: No  . Drug use: No  . Sexual activity: Yes  Lifestyle  . Physical activity:    Days per week: Not on file    Minutes per session: Not on file  . Stress: Not on file  Relationships  . Social connections:    Talks on phone: Not on file    Gets together: Not on file    Attends religious service: Not on file    Active member of club or organization: Not on file    Attends meetings of clubs or organizations: Not on file    Relationship status: Not on file  . Intimate partner violence:    Fear of current or ex partner: Not on file    Emotionally abused: Not on file    Physically abused: Not on file     Forced sexual activity: Not on file  Other Topics Concern  . Not on file  Social History Narrative   Patient is right handed.   Patient drinks 1 cup caffeine daily    Review of Systems  Constitutional: Negative for fatigue and unexpected weight change.  Eyes: Negative for visual disturbance.  Respiratory: Negative for cough, chest tightness and shortness of breath.   Cardiovascular: Negative for chest pain, palpitations and leg swelling.  Gastrointestinal: Negative for abdominal pain and blood in stool.  Neurological: Negative for dizziness, light-headedness and headaches.       Objective:   Physical Exam  Constitutional: He is oriented to person, place, and time. He appears well-developed and well-nourished.  HENT:  Head: Normocephalic and atraumatic.  Eyes: Pupils are equal, round, and reactive to light. EOM are normal.  Neck: No JVD present. Carotid bruit is not present.  Cardiovascular: Normal rate, regular rhythm and normal heart sounds.  No murmur heard. Pulmonary/Chest: Effort normal and breath sounds normal. He has no rales.  Musculoskeletal: He exhibits no edema.  Neurological: He is alert and oriented to person, place, and time.  Skin: Skin is warm and dry.  Psychiatric: He has a normal mood and affect.  Vitals reviewed.  Vitals:   11/03/17 1650  BP: 140/88  Pulse: 82  Resp: 18  Temp: 98.5 F (36.9 C)  TempSrc: Oral  SpO2: 97%  Weight: 210 lb 9.6 oz (95.5 kg)  Height: 5\' 8"  (1.727 m)       Assessment & Plan:   KAVAN DEVAN is a 62 y.o. male Prediabetes - Plan: Hemoglobin A1c  -Weight has improved somewhat.  Continue to work on diet with avoidance of sugar containing beverages, as well as some form of exercise/activity most days per week.  Recheck A1c.  Hyperlipidemia, unspecified hyperlipidemia type - Plan: Lipid panel  -Continue to work on diet/exercise, has had some weight improvement.  Will check lipids again, but discussed ASCVD approach with  statin recommendations.  Essential hypertension - Plan: Comprehensive metabolic panel  -Off medicines currently with borderline elevated reading.  Does report some higher readings at home, even when on medication, but also some episodes of dizziness.  We will initially start him back on same dose of lisinopril HCTZ 20/12.5 mg tablet, 1/2 tablet/day.  Monitor for either elevated blood pressure readings, or any return of dizziness or low readings to determine med changes.  Check CMP  Hypogonadism male - Plan: Testosterone, Free, Total, SHBG  -Previously low testosterone, treatment was cost prohibitive.  As we discussed last time there may be less expensive options that he can discuss with pharmacist.  Initially check testosterone level upcoming fasting blood work, and may need to have repeated for verification.  Meds ordered this encounter  Medications  . lisinopril-hydrochlorothiazide (PRINZIDE,ZESTORETIC) 20-12.5 MG tablet    Sig: Take 0.5 tablets by mouth daily.    Dispense:  45 tablet    Refill:  1   Patient Instructions    Restart same dose of blood pressure med - half each day. Keep a record of your blood pressures outside of the office and if running too low or lightheadedness - return to discuss med changes. If blood pressure is remaining over 140/90 - also return to discuss changes.   I will check prediabetes test again and other labs on fasting blood work. Try to avoid sugar containing beverages, and some form of activity/exercise most days per week.   I will check testosterone level on upcoming blood tests.   Thanks for coming in today.   IF you received an x-ray today, you will receive an invoice from [facility]. Please contact [facility] at [phone] with questions or concerns regarding your invoice.   IF you received labwork today, you will receive an invoice from [facility]. Please contact [facility] at [phone] with questions or concerns regarding your  invoice.   Our billing staff will not be able to assist you with  questions regarding bills from these companies.  You will be contacted with the lab results as soon as they are available. The fastest way to get your results is to activate your My Chart account. Instructions are located on the last page of this paperwork. If you have not heard from [place] regarding the results in 2 weeks, please contact this office.     Signed,   [name], MD [facility].  11/04/17 10:07 AM

## 2017-11-03 NOTE — Patient Instructions (Addendum)
  Restart same dose of blood pressure med - half each day. Keep a record of your blood pressures outside of the office and if running too low or lightheadedness - return to discuss med changes. If blood pressure is remaining over 140/90 - also return to discuss changes.   I will check prediabetes test again and other labs on fasting blood work. Try to avoid sugar containing beverages, and some form of activity/exercise most days per week.   I will check testosterone level on upcoming blood tests.   Thanks for coming in today.   IF you received an x-ray today, you will receive an invoice from [facility]. Please contact [facility] at [phone] with questions or concerns regarding your invoice.   IF you received labwork today, you will receive an invoice from [place]. Please contact [facility] at [phone] with questions or concerns regarding your invoice.   Our billing staff will not be able to assist you with questions regarding bills from these companies.  You will be contacted with the lab results as soon as they are available. The fastest way to get your results is to activate your My Chart account. Instructions are located on the last page of this paperwork. If you have not heard from [place] regarding the results in 2 weeks, please contact this office.

## 2017-11-04 ENCOUNTER — Encounter: Payer: Self-pay | Admitting: Family Medicine

## 2017-11-09 ENCOUNTER — Ambulatory Visit (INDEPENDENT_AMBULATORY_CARE_PROVIDER_SITE_OTHER): Payer: BC Managed Care – PPO | Admitting: Family Medicine

## 2017-11-09 DIAGNOSIS — R7303 Prediabetes: Secondary | ICD-10-CM

## 2017-11-09 DIAGNOSIS — I10 Essential (primary) hypertension: Secondary | ICD-10-CM

## 2017-11-09 DIAGNOSIS — R7989 Other specified abnormal findings of blood chemistry: Secondary | ICD-10-CM

## 2017-11-09 DIAGNOSIS — E291 Testicular hypofunction: Secondary | ICD-10-CM

## 2017-11-09 DIAGNOSIS — E785 Hyperlipidemia, unspecified: Secondary | ICD-10-CM

## 2017-11-09 NOTE — Progress Notes (Signed)
Lab only visit.  Patient not seen. 

## 2017-11-09 NOTE — Progress Notes (Signed)
Lab only visit 

## 2017-11-11 LAB — TESTOSTERONE, FREE, TOTAL, SHBG
Sex Hormone Binding: 27.5 nmol/L (ref 19.3–76.4)
Testosterone, Free: 4.9 pg/mL — ABNORMAL LOW (ref 6.6–18.1)
Testosterone: 177 ng/dL — ABNORMAL LOW (ref 264–916)

## 2017-11-11 LAB — LIPID PANEL
Chol/HDL Ratio: 4.7 ratio (ref 0.0–5.0)
Cholesterol, Total: 194 mg/dL (ref 100–199)
HDL: 41 mg/dL (ref >39)
LDL Calculated: 138 mg/dL — ABNORMAL HIGH (ref 0–99)
TRIGLYCERIDES: 77 mg/dL (ref 0–149)
VLDL Cholesterol Cal: 15 mg/dL (ref 5–40)

## 2017-11-11 LAB — COMPREHENSIVE METABOLIC PANEL
A/G RATIO: 1.4 (ref 1.2–2.2)
ALBUMIN: 4.1 g/dL (ref 3.6–4.8)
ALT: 21 IU/L (ref 0–44)
AST: 22 IU/L (ref 0–40)
Alkaline Phosphatase: 71 IU/L (ref 39–117)
BUN / CREAT RATIO: 14 (ref 10–24)
BUN: 14 mg/dL (ref 8–27)
Bilirubin Total: 0.5 mg/dL (ref 0.0–1.2)
CHLORIDE: 102 mmol/L (ref 96–106)
CO2: 27 mmol/L (ref 20–29)
Calcium: 9.3 mg/dL (ref 8.6–10.2)
Creatinine, Ser: 1.02 mg/dL (ref 0.76–1.27)
GFR calc non Af Amer: 78 mL/min/{1.73_m2} (ref >59)
GFR, EST AFRICAN AMERICAN: 91 mL/min/{1.73_m2} (ref >59)
GLOBULIN, TOTAL: 3 g/dL (ref 1.5–4.5)
Glucose: 102 mg/dL — ABNORMAL HIGH (ref 65–99)
POTASSIUM: 4.8 mmol/L (ref 3.5–5.2)
SODIUM: 143 mmol/L (ref 134–144)
TOTAL PROTEIN: 7.1 g/dL (ref 6.0–8.5)

## 2017-11-11 LAB — HEMOGLOBIN A1C
ESTIMATED AVERAGE GLUCOSE: 123 mg/dL
HEMOGLOBIN A1C: 5.9 % — AB (ref 4.8–5.6)

## 2017-11-17 ENCOUNTER — Encounter: Payer: Self-pay | Admitting: Family Medicine

## 2018-02-06 ENCOUNTER — Encounter: Payer: BC Managed Care – PPO | Admitting: Family Medicine

## 2018-02-07 ENCOUNTER — Encounter: Payer: Self-pay | Admitting: Family Medicine

## 2018-02-07 ENCOUNTER — Other Ambulatory Visit: Payer: Self-pay

## 2018-02-07 ENCOUNTER — Ambulatory Visit (INDEPENDENT_AMBULATORY_CARE_PROVIDER_SITE_OTHER): Payer: BC Managed Care – PPO | Admitting: Family Medicine

## 2018-02-07 VITALS — BP 134/80 | HR 64 | Temp 98.4°F | Ht 68.0 in | Wt 217.8 lb

## 2018-02-07 DIAGNOSIS — Z0000 Encounter for general adult medical examination without abnormal findings: Secondary | ICD-10-CM

## 2018-02-07 DIAGNOSIS — Z23 Encounter for immunization: Secondary | ICD-10-CM | POA: Diagnosis not present

## 2018-02-07 DIAGNOSIS — E291 Testicular hypofunction: Secondary | ICD-10-CM

## 2018-02-07 DIAGNOSIS — R7303 Prediabetes: Secondary | ICD-10-CM

## 2018-02-07 MED ORDER — ZOSTER VAC RECOMB ADJUVANTED 50 MCG/0.5ML IM SUSR
0.5000 mL | Freq: Once | INTRAMUSCULAR | 1 refills | Status: AC
Start: 2018-02-07 — End: 2018-02-07

## 2018-02-07 NOTE — Progress Notes (Signed)
Subjective:  By signing my name below, I, [name], attest that this documentation has been prepared under the direction and in the presence of [name], MD. Electronically Signed: [name], [place]. 02/07/2018 , 4:18 PM .  Patient was seen in Room 10 .   Patient ID: Cesar [name], male    DOB: Apr 29, 1956, 62 y.o.   MRN: [id number] Chief Complaint  Patient presents with  . Annual Exam    CPE   HPI Cesar [name] is a 62 y.o. male Here for annual physical. He has a history of HTN, hypogonadism, hyperlipidemia, and prediabetes.   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   HTN BP Readings from Last 3 Encounters:  02/07/18 134/80  11/03/17 140/88  04/11/17 (!) 146/90   Lab Results  Component Value Date   CREATININE 1.02 11/09/2017   He takes lisniopril-HCTZ 20/12.5mg  half pill qd.   Pre-diabetes Lab Results  Component Value Date   HGBA1C 5.9 (H) 11/09/2017   Wt Readings from Last 3 Encounters:  02/07/18 217 lb 12.8 oz (98.8 kg)  11/03/17 210 lb 9.6 oz (95.5 kg)  04/11/17 216 lb 12.8 oz (98.3 kg)    Hyperlipidemia  Lab Results  Component Value Date   CHOL 194 11/09/2017   HDL 41 11/09/2017   LDLCALC 138 (H) 11/09/2017   LDLDIRECT 158.5 12/17/2009   TRIG 77 11/09/2017   CHOLHDL 4.7 11/09/2017   Lab Results  Component Value Date   ALT 21 11/09/2017   AST 22 11/09/2017   ALKPHOS 71 11/09/2017   BILITOT 0.5 11/09/2017   He had 10-year ASCVD risk 18% when checked in June. Patient declines starting statin because he still wants to go with diet and exercise approach. He cuts grass and stands while walking around all day for work (he's a Pharmacist, hospital). He has plans to return working out at the gym. He hasn't returned to the gym since his episode at the gym. Neurologist thought he had a low blood sugar due to not eating prior to working out; has been cleared to returned to the gym to work out. His BP was noted to be a little high at the time, but  neurologist doesn't think it was a stroke.   Hypogonadism His testosterone was low when checked 1 year ago and 3 months ago. Recommended recheck testing between 8:00AM and 10:00AM to verify. He states he's still able to obtain an erection, but has decreased drive and takes more time to achieve an erection. He notes his wife is about 40 years older than him, married for 41 years.   Cancer Screening Colonoscopy: last done on 02/19/13 by Dr. Hilarie Fredrickson, multiple polyps removed with tubular adenomas; advised previously to call GI to schedule appointment as appears to be due.   Prostate cancer screening:  Lab Results  Component Value Date   PSA1 0.6 01/20/2017   He would like to defer PSA testing today.   Immunizations Immunization History  Administered Date(s) Administered  . Influenza,inj,Quad PF,6+ Mos 04/24/2014  . Tdap 11/26/2013   Shingles: he hasn't received it yet, but would like to receive it; prescription sent to pharmacy.  Flu shot: He agrees to flu shot today.   Depression Depression screen [facility] 2/9 02/07/2018 11/03/2017 01/20/2017 01/07/2017 04/10/2016  Decreased Interest 0 0 0 0 0  Down, Depressed, Hopeless 0 0 0 0 0  PHQ - 2 Score 0 0 0 0 0    Vision  Visual Acuity Screening  Right eye Left eye Both eyes  Without correction: 20/15 20/20 20/15   With correction:       Dentist He's followed by dentist, seen every 6 months.   Exercise He cuts grass and stands while walking around all day for work. He plans to return to working out at the gym.   Patient Active Problem List   Diagnosis Date Noted  . Hyperlipidemia 04/15/2016  . Right bundle branch block 04/15/2016  . Sleep apnea syndrome 12/29/2010  . DIZZINESS 07/15/2010  . Dyslipidemia 07/14/2010  . TESTOSTERONE DEFICIENCY 05/21/2008  . Essential hypertension 01/15/2008  . SHOULDER, PAIN 01/15/2008   Past Medical History:  Diagnosis Date  . Arthritis    "shoulders" (04/23/2014)  . Bursitis of shoulder   .  HYPERTENSION 01/15/2008  . Hypertension   . OSA on CPAP   . SHOULDER, PAIN 01/15/2008  . Sinus headache    "take OTC medications prn"  . Tendonitis of shoulder    "both"  . TESTOSTERONE DEFICIENCY 05/21/2008  . TIA (transient ischemic attack) 04/23/2014   "that's what they think I've had" (04/23/2014)   Past Surgical History:  Procedure Laterality Date  . APPENDECTOMY  1978  . INGUINAL HERNIA REPAIR Left 1973  . KNEE ARTHROSCOPY WITH PATELLAR TENDON REPAIR Left 12/21/2012   Procedure: LEFT KNEE ARTHROSCOPY WITH MEDIAL and LATERALMENISECTOMY AND QUARDRICEP TENDON REPAIR;  Surgeon: Ninetta Lights, MD;  Location: [facility];  Service: Orthopedics;  Laterality: Left;  . PLANTAR FASCIA SURGERY Left 7/10   heel  . TONSILLECTOMY  1960's   Allergies  Allergen Reactions  . Codeine Nausea And Vomiting   Prior to Admission medications   Medication Sig Start Date End Date Taking? Authorizing Provider  aspirin EC 81 MG EC tablet Take 1 tablet (81 mg total) by mouth daily. 04/24/14   [name], [name], MD  Chlorpheniramine-Phenylephrine (SINUS & ALLERGY PO) Take 1 tablet by mouth 2 (two) times daily as needed (allergies).    [provider]  diazepam (VALIUM) 5 MG tablet Take 0.5-1 tablets (2.5-5 mg total) by mouth every 12 (twelve) hours as needed for anxiety. Or vertigo. 04/10/16   [name], MD  fluticasone (FLONASE) 50 MCG/ACT nasal spray Place 2 sprays into both nostrils daily. 04/10/16   Kirichenko, Lahoma Rocker, PA-C  lisinopril-hydrochlorothiazide (PRINZIDE,ZESTORETIC) 20-12.5 MG tablet Take 0.5 tablets by mouth daily. 11/03/17   [name], MD  meclizine (ANTIVERT) 25 MG tablet Take 1 tablet (25 mg total) by mouth 3 (three) times daily as needed for dizziness. 04/10/16   [name], MD  NON FORMULARY Place 1 Device into the nose at bedtime. CPAP    [provider]   Social History   Socioeconomic History  . Marital status: Married     Spouse name: Not on file  . Number of children: 5  . Years of education: Not on file  . Highest education level: Not on file  Occupational History  . Occupation: Pharmacist, hospital  Social Needs  . Financial resource strain: Not on file  . Food insecurity:    Worry: Not on file    Inability: Not on file  . Transportation needs:    Medical: Not on file    Non-medical: Not on file  Tobacco Use  . Smoking status: Never Smoker  . Smokeless tobacco: Never Used  Substance and Sexual Activity  . Alcohol use: No  . Drug use: No  . Sexual activity: Yes  Lifestyle  . Physical activity:  Days per week: Not on file    Minutes per session: Not on file  . Stress: Not on file  Relationships  . Social connections:    Talks on phone: Not on file    Gets together: Not on file    Attends religious service: Not on file    Active member of club or organization: Not on file    Attends meetings of clubs or organizations: Not on file    Relationship status: Not on file  . Intimate partner violence:    Fear of current or ex partner: Not on file    Emotionally abused: Not on file    Physically abused: Not on file    Forced sexual activity: Not on file  Other Topics Concern  . Not on file  Social History Narrative   Patient is right handed.   Patient drinks 1 cup caffeine daily   Review of Systems 13 point ROS - negative     Objective:   Physical Exam  Constitutional: He is oriented to person, place, and time. He appears well-developed and well-nourished.  HENT:  Head: Normocephalic and atraumatic.  Right Ear: External ear normal.  Left Ear: External ear normal.  Mouth/Throat: Oropharynx is clear and moist.  Eyes: Pupils are equal, round, and reactive to light. Conjunctivae and EOM are normal.  Neck: Normal range of motion. Neck supple. No thyromegaly present.  Cardiovascular: Normal rate, regular rhythm, normal heart sounds and intact distal pulses.  Pulmonary/Chest: Effort normal and breath  sounds normal. No respiratory distress. He has no wheezes.  Abdominal: Soft. He exhibits no distension. There is no tenderness.  Musculoskeletal: Normal range of motion. He exhibits no edema or tenderness.  Lymphadenopathy:    He has no cervical adenopathy.  Neurological: He is alert and oriented to person, place, and time. He has normal reflexes.  Skin: Skin is warm and dry.  Psychiatric: He has a normal mood and affect. His behavior is normal.  Vitals reviewed.   Vitals:   02/07/18 1538  BP: 134/80  Pulse: 64  Temp: 98.4 F (36.9 C)  TempSrc: Oral  SpO2: 99%  Weight: 217 lb 12.8 oz (98.8 kg)  Height: 5\' 8"  (1.727 m)       Assessment & Plan:   TYLEE NEWBY is a 61 y.o. male Annual physical exam  - -anticipatory guidance as below in AVS, screening labs above. Health maintenance items as above in HPI discussed/recommended as applicable.   -We discussed pros and cons of prostate cancer screening, and after this discussion, he chose to defer screening at this time.   Hypogonadism in male - Plan: Testosterone, Free, Total, SHBG  -Plans to return for lab only visit, morning blood draw for recheck of previous low reading.  His testosterone is low and plan for supplementation, would need to check baseline PSA  Prediabetes  -Plans to increase exercise, diet changes with goal of weight loss to treat both prediabetes as well as hyperlipidemia after declines statin at this time.  Recheck in the next 3 months   needs flu shot - Plan: Flu Vaccine QUAD 36+ mos IM  Need for shingles vaccine - Plan: Zoster Vaccine Adjuvanted [facility]) injection sent to pharmacy.    Meds ordered this encounter  Medications  . Zoster Vaccine Adjuvanted [facility]) injection    Sig: Inject 0.5 mLs into the muscle once for 1 dose. Repeat in 2-6 months.    Dispense:  0.5 mL    Refill:  1  Patient Instructions   Increase exercise to help with cholesterol and blood sugar. Start low intensity and  increase slowly. Most days per week, with goal of 150 minutes or more per week. Try to decrease sweet tea and lemonade.   Plan on recheck cholesterol and blood sugar in 3 months.   Return for lab only visit between 8 and 10am.   Call gastroenterology to schedule colonoscopy.   Thanks for coming in today.   Keeping you healthy  Get these tests  Blood pressure- Have your blood pressure checked once a year by your healthcare provider.  Normal blood pressure is 120/80  Weight- Have your body mass index (BMI) calculated to screen for obesity.  BMI is a measure of body fat based on height and weight. You can also calculate your own BMI at [email or web address].  Cholesterol- Have your cholesterol checked every year.  Diabetes- Have your blood sugar checked regularly if you have high blood pressure, high cholesterol, have a family history of diabetes or if you are overweight.  Screening for Colon Cancer- Colonoscopy starting at age 56.  Screening may begin sooner depending on your family history and other health conditions. Follow up colonoscopy as directed by your Gastroenterologist.  Screening for Prostate Cancer- Both blood work (PSA) and a rectal exam help screen for Prostate Cancer.  Screening begins at age 96 with African-American men and at age 74 with Caucasian men.  Screening may begin sooner depending on your family history.  Take these medicines  Aspirin- One aspirin daily can help prevent Heart disease and Stroke.  Flu shot- Every fall.  Tetanus- Every 10 years.  Zostavax- Once after the age of 60 to prevent Shingles.  Pneumonia shot- Once after the age of 64; if you are younger than 72, ask your healthcare provider if you need a Pneumonia shot.  Take these steps  Don't smoke- If you do smoke, talk to your doctor about quitting.  For tips on how to quit, go to www.smokefree.gov or call 1-800-QUIT-NOW.  Be physically active- Exercise 5 days a week for at least 30  minutes.  If you are not already physically active start slow and gradually work up to 30 minutes of moderate physical activity.  Examples of moderate activity include walking briskly, mowing the yard, dancing, swimming, bicycling, etc.  Eat a healthy diet- Eat a variety of healthy food such as fruits, vegetables, low fat milk, low fat cheese, yogurt, lean meant, poultry, fish, beans, tofu, etc. For more information go to www.thenutritionsource.org  Drink alcohol in moderation- Limit alcohol intake to less than two drinks a day. Never drink and drive.  Dentist- Brush and floss twice daily; visit your dentist twice a year.  Depression- Your emotional health is as important as your physical health. If you're feeling down, or losing interest in things you would normally enjoy please talk to your healthcare provider.  Eye exam- Visit your eye doctor every year.  Safe sex- If you may be exposed to a sexually transmitted infection, use a condom.  Seat belts- Seat belts can save your life; always wear one.  Smoke/Carbon Monoxide detectors- These detectors need to be installed on the appropriate level of your home.  Replace batteries at least once a year.  Skin cancer- When out in the sun, cover up and use sunscreen 15 SPF or higher.  Violence- If anyone is threatening you, please tell your healthcare provider.  Living Will/ Health care power of attorney- Speak with your healthcare provider and family.  Keeping you healthy  Get these tests  Blood pressure- Have your blood pressure checked once a year by your healthcare provider.  Normal blood pressure is 120/80  Weight- Have your body mass index (BMI) calculated to screen for obesity.  BMI is a measure of body fat based on height and weight. You can also calculate your own BMI at [email or web address].  Cholesterol- Have your cholesterol checked every year.  Diabetes- Have your blood sugar checked regularly if you have high blood  pressure, high cholesterol, have a family history of diabetes or if you are overweight.  Screening for Colon Cancer- Colonoscopy starting at age 5.  Screening may begin sooner depending on your family history and other health conditions. Follow up colonoscopy as directed by your Gastroenterologist.  Screening for Prostate Cancer- Both blood work (PSA) and a rectal exam help screen for Prostate Cancer.  Screening begins at age 12 with African-American men and at age 12 with Caucasian men.  Screening may begin sooner depending on your family history.  Take these medicines  Aspirin- One aspirin daily can help prevent Heart disease and Stroke.  Flu shot- Every fall.  Tetanus- Every 10 years.  Zostavax- Once after the age of 4 to prevent Shingles.  Pneumonia shot- Once after the age of 56; if you are younger than 18, ask your healthcare provider if you need a Pneumonia shot.  Take these steps  Don't smoke- If you do smoke, talk to your doctor about quitting.  For tips on how to quit, go to www.smokefree.gov or call 1-800-QUIT-NOW.  Be physically active- Exercise 5 days a week for at least 30 minutes.  If you are not already physically active start slow and gradually work up to 30 minutes of moderate physical activity.  Examples of moderate activity include walking briskly, mowing the yard, dancing, swimming, bicycling, etc.  Eat a healthy diet- Eat a variety of healthy food such as fruits, vegetables, low fat milk, low fat cheese, yogurt, lean meant, poultry, fish, beans, tofu, etc. For more information go to www.thenutritionsource.org  Drink alcohol in moderation- Limit alcohol intake to less than two drinks a day. Never drink and drive.  Dentist- Brush and floss twice daily; visit your dentist twice a year.  Depression- Your emotional health is as important as your physical health. If you're feeling down, or losing interest in things you would normally enjoy please talk to your  healthcare provider.  Eye exam- Visit your eye doctor every year.  Safe sex- If you may be exposed to a sexually transmitted infection, use a condom.  Seat belts- Seat belts can save your life; always wear one.  Smoke/Carbon Monoxide detectors- These detectors need to be installed on the appropriate level of your home.  Replace batteries at least once a year.  Skin cancer- When out in the sun, cover up and use sunscreen 15 SPF or higher.  Violence- If anyone is threatening you, please tell your healthcare provider.  Living Will/ Health care power of attorney- Speak with your healthcare provider and family.  If you have lab work done today you will be contacted with your lab results within the next 2 weeks.  If you have not heard from [place] then please contact us. The fastest way to get your results is to register for My Chart.   IF you received an x-ray today, you will receive an invoice from [facility] Radiology. Please contact [facility] Radiology at [phone] with questions or concerns regarding your invoice.   IF  you received labwork today, you will receive an invoice from [facility]. Please contact [facility] at [phone] with questions or concerns regarding your invoice.   Our billing staff will not be able to assist you with questions regarding bills from these companies.  You will be contacted with the lab results as soon as they are available. The fastest way to get your results is to activate your My Chart account. Instructions are located on the last page of this paperwork. If you have not heard from [place] regarding the results in 2 weeks, please contact this office.       I personally performed the services described in this documentation, which was scribed in my presence. The recorded information has been reviewed and considered for accuracy and completeness, addended by me as needed, and agree with information above.  Signed,   [name], MD [facility].  02/07/18 9:31 PM

## 2018-02-07 NOTE — Patient Instructions (Addendum)
Increase exercise to help with cholesterol and blood sugar. Start low intensity and increase slowly. Most days per week, with goal of 150 minutes or more per week. Try to decrease sweet tea and lemonade.   Plan on recheck cholesterol and blood sugar in 3 months.   Return for lab only visit between 8 and 10am.   Call gastroenterology to schedule colonoscopy.   Thanks for coming in today.   Keeping you healthy  Get these tests  Blood pressure- Have your blood pressure checked once a year by your healthcare provider.  Normal blood pressure is 120/80  Weight- Have your body mass index (BMI) calculated to screen for obesity.  BMI is a measure of body fat based on height and weight. You can also calculate your own BMI at [email or web address].[email or web address].  Cholesterol- Have your cholesterol checked every year.  Diabetes- Have your blood sugar checked regularly if you have high blood pressure, high cholesterol, have a family history of diabetes or if you are overweight.  Screening for Colon Cancer- Colonoscopy starting at age 75.  Screening may begin sooner depending on your family history and other health conditions. Follow up colonoscopy as directed by your Gastroenterologist.  Screening for Prostate Cancer- Both blood work (PSA) and a rectal exam help screen for Prostate Cancer.  Screening begins at age 89 with African-American men and at age 42 with Caucasian men.  Screening may begin sooner depending on your family history.  Take these medicines  Aspirin- One aspirin daily can help prevent Heart disease and Stroke.  Flu shot- Every fall.  Tetanus- Every 10 years.  Zostavax- Once after the age of 25 to prevent Shingles.  Pneumonia shot- Once after the age of 59; if you are younger than 35, ask your healthcare provider if you need a Pneumonia shot.  Take these steps  Don't smoke- If you do smoke, talk to your doctor about quitting.  For tips on how to quit, go to www.smokefree.gov or call  1-800-QUIT-NOW.  Be physically active- Exercise 5 days a week for at least 30 minutes.  If you are not already physically active start slow and gradually work up to 30 minutes of moderate physical activity.  Examples of moderate activity include walking briskly, mowing the yard, dancing, swimming, bicycling, etc.  Eat a healthy diet- Eat a variety of healthy food such as fruits, vegetables, low fat milk, low fat cheese, yogurt, lean meant, poultry, fish, beans, tofu, etc. For more information go to www.thenutritionsource.org  Drink alcohol in moderation- Limit alcohol intake to less than two drinks a day. Never drink and drive.  Dentist- Brush and floss twice daily; visit your dentist twice a year.  Depression- Your emotional health is as important as your physical health. If you're feeling down, or losing interest in things you would normally enjoy please talk to your healthcare provider.  Eye exam- Visit your eye doctor every year.  Safe sex- If you may be exposed to a sexually transmitted infection, use a condom.  Seat belts- Seat belts can save your life; always wear one.  Smoke/Carbon Monoxide detectors- These detectors need to be installed on the appropriate level of your home.  Replace batteries at least once a year.  Skin cancer- When out in the sun, cover up and use sunscreen 15 SPF or higher.  Violence- If anyone is threatening you, please tell your healthcare provider.  Living Will/ Health care power of attorney- Speak with your healthcare provider and family.  Keeping you healthy  Get these tests  Blood pressure- Have your blood pressure checked once a year by your healthcare provider.  Normal blood pressure is 120/80  Weight- Have your body mass index (BMI) calculated to screen for obesity.  BMI is a measure of body fat based on height and weight. You can also calculate your own BMI at [employer].  Cholesterol- Have your cholesterol checked every  year.  Diabetes- Have your blood sugar checked regularly if you have high blood pressure, high cholesterol, have a family history of diabetes or if you are overweight.  Screening for Colon Cancer- Colonoscopy starting at age 76.  Screening may begin sooner depending on your family history and other health conditions. Follow up colonoscopy as directed by your Gastroenterologist.  Screening for Prostate Cancer- Both blood work (PSA) and a rectal exam help screen for Prostate Cancer.  Screening begins at age 37 with African-American men and at age 62 with Caucasian men.  Screening may begin sooner depending on your family history.  Take these medicines  Aspirin- One aspirin daily can help prevent Heart disease and Stroke.  Flu shot- Every fall.  Tetanus- Every 10 years.  Zostavax- Once after the age of 54 to prevent Shingles.  Pneumonia shot- Once after the age of 64; if you are younger than 31, ask your healthcare provider if you need a Pneumonia shot.  Take these steps  Don't smoke- If you do smoke, talk to your doctor about quitting.  For tips on how to quit, go to www.smokefree.gov or call 1-800-QUIT-NOW.  Be physically active- Exercise 5 days a week for at least 30 minutes.  If you are not already physically active start slow and gradually work up to 30 minutes of moderate physical activity.  Examples of moderate activity include walking briskly, mowing the yard, dancing, swimming, bicycling, etc.  Eat a healthy diet- Eat a variety of healthy food such as fruits, vegetables, low fat milk, low fat cheese, yogurt, lean meant, poultry, fish, beans, tofu, etc. For more information go to www.thenutritionsource.org  Drink alcohol in moderation- Limit alcohol intake to less than two drinks a day. Never drink and drive.  Dentist- Brush and floss twice daily; visit your dentist twice a year.  Depression- Your emotional health is as important as your physical health. If you're feeling down,  or losing interest in things you would normally enjoy please talk to your healthcare provider.  Eye exam- Visit your eye doctor every year.  Safe sex- If you may be exposed to a sexually transmitted infection, use a condom.  Seat belts- Seat belts can save your life; always wear one.  Smoke/Carbon Monoxide detectors- These detectors need to be installed on the appropriate level of your home.  Replace batteries at least once a year.  Skin cancer- When out in the sun, cover up and use sunscreen 15 SPF or higher.  Violence- If anyone is threatening you, please tell your healthcare provider.  Living Will/ Health care power of attorney- Speak with your healthcare provider and family.  If you have lab work done today you will be contacted with your lab results within the next 2 weeks.  If you have not heard from [place] then please contact us. The fastest way to get your results is to register for My Chart.   IF you received an x-ray today, you will receive an invoice from [facility]. Please contact [facility] at [phone] with questions or concerns regarding your invoice.   IF you received labwork today,  you will receive an invoice from [place]. Please contact [facility] at [phone] with questions or concerns regarding your invoice.   Our billing staff will not be able to assist you with questions regarding bills from these companies.  You will be contacted with the lab results as soon as they are available. The fastest way to get your results is to activate your My Chart account. Instructions are located on the last page of this paperwork. If you have not heard from [place] regarding the results in 2 weeks, please contact this office.

## 2018-05-24 ENCOUNTER — Other Ambulatory Visit: Payer: Self-pay | Admitting: Family Medicine

## 2018-05-25 NOTE — Telephone Encounter (Signed)
Requested Prescriptions  Pending Prescriptions Disp Refills  . lisinopril-hydrochlorothiazide (PRINZIDE,ZESTORETIC) 20-12.5 MG tablet [Pharmacy Med Name: LISINOPRIL-HCTZ 20/12.5MG  TABLETS] 45 tablet 1    Sig: TAKE 1/2 TABLET BY MOUTH DAILY     Cardiovascular:  ACEI + Diuretic Combos Failed - 05/24/2018  3:18 AM      Failed - Na in normal range and within 180 days    Sodium  Date Value Ref Range Status  11/09/2017 143 134 - 144 mmol/L Final         Failed - K in normal range and within 180 days    Potassium  Date Value Ref Range Status  11/09/2017 4.8 3.5 - 5.2 mmol/L Final         Failed - Cr in normal range and within 180 days    Creatinine, Ser  Date Value Ref Range Status  11/09/2017 1.02 0.76 - 1.27 mg/dL Final         Failed - Ca in normal range and within 180 days    Calcium  Date Value Ref Range Status  11/09/2017 9.3 8.6 - 10.2 mg/dL Final         Passed - Patient is not pregnant      Passed - Last BP in normal range    BP Readings from Last 1 Encounters:  02/07/18 134/80         Passed - Valid encounter within last 6 months    Recent Outpatient Visits          3 months ago Annual physical exam   [facility] [place], MD   6 months ago Hypogonadism male   [facility], Ranell Patrick, MD   6 months ago Prediabetes   [facility], Ranell Patrick, MD   1 year ago Trigger thumb, left thumb   [facility], Ranell Patrick, MD   1 year ago Annual physical exam   [facility], Ranell Patrick, MD

## 2019-03-08 ENCOUNTER — Other Ambulatory Visit: Payer: Self-pay | Admitting: Family Medicine

## 2019-03-08 NOTE — Telephone Encounter (Signed)
Requested medication (s) are due for refill today: yes  Requested medication (s) are on the active medication list: yes  Last refill:  11/04/2018  Future visit scheduled: no  Notes to clinic:  Review for refill   Requested Prescriptions  Pending Prescriptions Disp Refills   lisinopril-hydrochlorothiazide (ZESTORETIC) 20-12.5 MG tablet [Pharmacy Med Name: LISINOPRIL-HCTZ 20/12.5MG  TABLETS] 45 tablet 1    Sig: TAKE 1/2 TABLET BY MOUTH DAILY     Cardiovascular:  ACEI + Diuretic Combos Failed - 03/08/2019 10:56 AM      Failed - Na in normal range and within 180 days    Sodium  Date Value Ref Range Status  11/09/2017 143 134 - 144 mmol/L Final         Failed - K in normal range and within 180 days    Potassium  Date Value Ref Range Status  11/09/2017 4.8 3.5 - 5.2 mmol/L Final         Failed - Cr in normal range and within 180 days    Creatinine, Ser  Date Value Ref Range Status  11/09/2017 1.02 0.76 - 1.27 mg/dL Final         Failed - Ca in normal range and within 180 days    Calcium  Date Value Ref Range Status  11/09/2017 9.3 8.6 - 10.2 mg/dL Final         Failed - Valid encounter within last 6 months    Recent Outpatient Visits          1 year ago Annual physical exam   [facility], Ranell Patrick, MD   1 year ago Hypogonadism male   [facility], Ranell Patrick, MD   1 year ago Prediabetes   [facility], Ranell Patrick, MD   1 year ago Trigger thumb, left thumb   [facility], Ranell Patrick, MD   2 years ago Annual physical exam   [facility], Ranell Patrick, MD             Passed - Patient is not pregnant      Passed - Last BP in normal range    BP Readings from Last 1 Encounters:  02/07/18 134/80

## 2019-03-09 NOTE — Telephone Encounter (Signed)
Attempted to call pt on both numbers. No answer and unble to leave vm.   Plan: pt has not been seen for almost a year now. Please have pt schedule appt prior to refilling any medications

## 2019-03-09 NOTE — Telephone Encounter (Signed)
Called pt to schedule appt. VM not left, box full

## 2019-03-12 ENCOUNTER — Other Ambulatory Visit: Payer: Self-pay

## 2019-03-12 ENCOUNTER — Telehealth (INDEPENDENT_AMBULATORY_CARE_PROVIDER_SITE_OTHER): Payer: BC Managed Care – PPO | Admitting: Family Medicine

## 2019-03-12 ENCOUNTER — Encounter: Payer: Self-pay | Admitting: Family Medicine

## 2019-03-12 VITALS — Temp 97.6°F

## 2019-03-12 DIAGNOSIS — I10 Essential (primary) hypertension: Secondary | ICD-10-CM | POA: Diagnosis not present

## 2019-03-12 DIAGNOSIS — R7303 Prediabetes: Secondary | ICD-10-CM | POA: Diagnosis not present

## 2019-03-12 DIAGNOSIS — G4733 Obstructive sleep apnea (adult) (pediatric): Secondary | ICD-10-CM

## 2019-03-12 DIAGNOSIS — Z9989 Dependence on other enabling machines and devices: Secondary | ICD-10-CM

## 2019-03-12 DIAGNOSIS — E785 Hyperlipidemia, unspecified: Secondary | ICD-10-CM | POA: Diagnosis not present

## 2019-03-12 MED ORDER — LISINOPRIL-HYDROCHLOROTHIAZIDE 20-12.5 MG PO TABS
0.5000 | ORAL_TABLET | Freq: Every day | ORAL | 1 refills | Status: DC
Start: 2019-03-12 — End: 2019-06-13

## 2019-03-12 NOTE — Progress Notes (Signed)
Virtual Visit via Telephone Note  I connected with Cesar [name] on 03/12/19 at 5:59 PM by telephone and verified that I am speaking with the correct person using two identifiers.   I discussed the limitations, risks, security and privacy concerns of performing an evaluation and management service by telephone and the availability of in person appointments. I also discussed with the patient that there may be a patient responsible charge related to this service. The patient expressed understanding and agreed to proceed, consent obtained  Chief complaint: Med refills.   History of Present Illness: Cesar [name] is a 62 y.o. male  Hypertension: BP Readings from Last 3 Encounters:  02/07/18 134/80  11/03/17 140/88  04/11/17 (!) 146/90   Lab Results  Component Value Date   CREATININE 1.02 11/09/2017  lisinopril HCT 20/12.5mg  - 1/2 pill per day.  Home readings: 144/87 today. Off meds past week. When on meds - 132/77-78.  Constitutional: Negative for fatigue and unexpected weight change.  Eyes: Negative for visual disturbance.  Respiratory: Negative for cough, chest tightness and shortness of breath.   Cardiovascular: Negative for chest pain, palpitations and leg swelling.  Gastrointestinal: Negative for abdominal pain and blood in stool.  Neurological: Negative for dizziness, light-headedness and headaches.    Hyperlipidemia:  Lab Results  Component Value Date   CHOL 194 11/09/2017   HDL 41 11/09/2017   LDLCALC 138 (H) 11/09/2017   LDLDIRECT 158.5 12/17/2009   TRIG 77 11/09/2017   CHOLHDL 4.7 11/09/2017   Lab Results  Component Value Date   ALT 21 11/09/2017   AST 22 11/09/2017   ALKPHOS 71 11/09/2017   BILITOT 0.5 11/09/2017  The ASCVD Risk score [name] DC Jr., et al., 2013) failed to calculate for the following reasons:   The systolic blood pressure is missing   Prediabetes:  Lab Results  Component Value Date   HGBA1C 5.9 (H) 11/09/2017  home weight 205-209.   Wt Readings from Last 3 Encounters:  02/07/18 217 lb 12.8 oz (98.8 kg)  11/03/17 210 lb 9.6 oz (95.5 kg)  04/11/17 216 lb 12.8 oz (98.3 kg)   OSA on CPAP Last sleep study about 8 yrs ago.  Needs new machine, but told may need new machine.   Last study in 2012.  severe OSA with AHI 79, initial setting of 12? Thinks may have been 4-5.     Patient Active Problem List   Diagnosis Date Noted  . Hyperlipidemia 04/15/2016  . Right bundle branch block 04/15/2016  . Sleep apnea syndrome 12/29/2010  . DIZZINESS 07/15/2010  . Dyslipidemia 07/14/2010  . TESTOSTERONE DEFICIENCY 05/21/2008  . Essential hypertension 01/15/2008  . SHOULDER, PAIN 01/15/2008   Past Medical History:  Diagnosis Date  . Arthritis    "shoulders" (04/23/2014)  . Bursitis of shoulder   . HYPERTENSION 01/15/2008  . Hypertension   . OSA on CPAP   . SHOULDER, PAIN 01/15/2008  . Sinus headache    "take OTC medications prn"  . Tendonitis of shoulder    "both"  . TESTOSTERONE DEFICIENCY 05/21/2008  . TIA (transient ischemic attack) 04/23/2014   "that's what they think I've had" (04/23/2014)   Past Surgical History:  Procedure Laterality Date  . APPENDECTOMY  1978  . INGUINAL HERNIA REPAIR Left 1973  . KNEE ARTHROSCOPY WITH PATELLAR TENDON REPAIR Left 12/21/2012   Procedure: LEFT KNEE ARTHROSCOPY WITH MEDIAL and LATERALMENISECTOMY AND QUARDRICEP TENDON REPAIR;  Surgeon: Ninetta Lights, MD;  Location: [facility];  Service: Orthopedics;  Laterality: Left;  . PLANTAR FASCIA SURGERY Left 7/10   heel  . TONSILLECTOMY  1960's   Allergies  Allergen Reactions  . Codeine Nausea And Vomiting   Prior to Admission medications   Medication Sig Start Date End Date Taking? Authorizing Provider  aspirin EC 81 MG EC tablet Take 1 tablet (81 mg total) by mouth daily. 04/24/14  Yes [name], [name], MD  lisinopril-hydrochlorothiazide (PRINZIDE,ZESTORETIC) 20-12.5 MG tablet TAKE 1/2 TABLET BY MOUTH DAILY  05/25/18  Yes [name], MD  NON FORMULARY Place 1 Device into the nose at bedtime. CPAP   Yes [provider]  Chlorpheniramine-Phenylephrine (SINUS & ALLERGY PO) Take 1 tablet by mouth 2 (two) times daily as needed (allergies).    [provider]   Social History   Socioeconomic History  . Marital status: Married    Spouse name: Not on file  . Number of children: 5  . Years of education: Not on file  . Highest education level: Not on file  Occupational History  . Occupation: Pharmacist, hospital  Social Needs  . Financial resource strain: Not on file  . Food insecurity    Worry: Not on file    Inability: Not on file  . Transportation needs    Medical: Not on file    Non-medical: Not on file  Tobacco Use  . Smoking status: Never Smoker  . Smokeless tobacco: Never Used  Substance and Sexual Activity  . Alcohol use: No  . Drug use: No  . Sexual activity: Yes  Lifestyle  . Physical activity    Days per week: Not on file    Minutes per session: Not on file  . Stress: Not on file  Relationships  . Social Herbalist on phone: Not on file    Gets together: Not on file    Attends religious service: Not on file    Active member of club or organization: Not on file    Attends meetings of clubs or organizations: Not on file    Relationship status: Not on file  . Intimate partner violence    Fear of current or ex partner: Not on file    Emotionally abused: Not on file    Physically abused: Not on file    Forced sexual activity: Not on file  Other Topics Concern  . Not on file  Social History Narrative   Patient is right handed.   Patient drinks 1 cup caffeine daily        Patient Active Problem List   Diagnosis Date Noted  . Hyperlipidemia 04/15/2016  . Right bundle branch block 04/15/2016  . Sleep apnea syndrome 12/29/2010  . DIZZINESS 07/15/2010  . Dyslipidemia 07/14/2010  . TESTOSTERONE DEFICIENCY 05/21/2008  . Essential hypertension  01/15/2008  . SHOULDER, PAIN 01/15/2008   Past Medical History:  Diagnosis Date  . Arthritis    "shoulders" (04/23/2014)  . Bursitis of shoulder   . HYPERTENSION 01/15/2008  . Hypertension   . OSA on CPAP   . SHOULDER, PAIN 01/15/2008  . Sinus headache    "take OTC medications prn"  . Tendonitis of shoulder    "both"  . TESTOSTERONE DEFICIENCY 05/21/2008  . TIA (transient ischemic attack) 04/23/2014   "that's what they think I've had" (04/23/2014)   Past Surgical History:  Procedure Laterality Date  . APPENDECTOMY  1978  . INGUINAL HERNIA REPAIR Left 1973  . KNEE ARTHROSCOPY WITH PATELLAR TENDON REPAIR Left 12/21/2012  Procedure: LEFT KNEE ARTHROSCOPY WITH MEDIAL and LATERALMENISECTOMY AND QUARDRICEP TENDON REPAIR;  Surgeon: Ninetta Lights, MD;  Location: [facility];  Service: Orthopedics;  Laterality: Left;  . PLANTAR FASCIA SURGERY Left 7/10   heel  . TONSILLECTOMY  1960's   Allergies  Allergen Reactions  . Codeine Nausea And Vomiting   Prior to Admission medications   Medication Sig Start Date End Date Taking? Authorizing Provider  aspirin EC 81 MG EC tablet Take 1 tablet (81 mg total) by mouth daily. 04/24/14  Yes [name], [name], MD  lisinopril-hydrochlorothiazide (PRINZIDE,ZESTORETIC) 20-12.5 MG tablet TAKE 1/2 TABLET BY MOUTH DAILY 05/25/18  Yes [name], MD  NON FORMULARY Place 1 Device into the nose at bedtime. CPAP   Yes [provider]  Chlorpheniramine-Phenylephrine (SINUS & ALLERGY PO) Take 1 tablet by mouth 2 (two) times daily as needed (allergies).    [provider]   Social History   Socioeconomic History  . Marital status: Married    Spouse name: Not on file  . Number of children: 5  . Years of education: Not on file  . Highest education level: Not on file  Occupational History  . Occupation: Pharmacist, hospital  Social Needs  . Financial resource strain: Not on file  . Food insecurity    Worry: Not on file     Inability: Not on file  . Transportation needs    Medical: Not on file    Non-medical: Not on file  Tobacco Use  . Smoking status: Never Smoker  . Smokeless tobacco: Never Used  Substance and Sexual Activity  . Alcohol use: No  . Drug use: No  . Sexual activity: Yes  Lifestyle  . Physical activity    Days per week: Not on file    Minutes per session: Not on file  . Stress: Not on file  Relationships  . Social Herbalist on phone: Not on file    Gets together: Not on file    Attends religious service: Not on file    Active member of club or organization: Not on file    Attends meetings of clubs or organizations: Not on file    Relationship status: Not on file  . Intimate partner violence    Fear of current or ex partner: Not on file    Emotionally abused: Not on file    Physically abused: Not on file    Forced sexual activity: Not on file  Other Topics Concern  . Not on file  Social History Narrative   Patient is right handed.   Patient drinks 1 cup caffeine daily     Observations/Objective: No distress. Home BP and weight above.  All questions answered.  Vitals:   03/12/19 1511  Temp: 97.6 F (36.4 C)  TempSrc: Temporal     Assessment and Plan: Prediabetes - Plan: Hemoglobin A1c  -Home weights improved.  Anticipate improved breathing.  Plan for A1c in office on lab only visit next few days.  Hyperlipidemia, unspecified hyperlipidemia type - Plan: Comprehensive metabolic panel, Lipid panel  -Check labs, then determine ASCVD risk.  Continue to watch diet, exercise.  Essential hypertension - Plan: Comprehensive metabolic panel  -Stable by home readings on meds, restart same dose lisinopril HCTZ with an office eval in 3 months  OSA on CPAP - Plan: Ambulatory referral to Sleep Studies  -Due for new/updated testing likely for new machine.  Referred to sleep specialist.  Follow Up Instructions:  3 months in office.   I discussed the assessment and  treatment plan with the patient. The patient was provided an opportunity to ask questions and all were answered. The patient agreed with the plan and demonstrated an understanding of the instructions.   The patient was advised to call back or seek an in-person evaluation if the symptoms worsen or if the condition fails to improve as anticipated.  I provided 10 minutes of non-face-to-face time during this encounter.  Signed,   [name], MD [facility].  03/12/19

## 2019-03-12 NOTE — Patient Instructions (Signed)
° ° ° °  If you have lab work done today you will be contacted with your lab results within the next 2 weeks.  If you have not heard from us then please contact us. The fastest way to get your results is to register for My Chart. ° ° °IF you received an x-ray today, you will receive an invoice from [facility]. Please contact [facility] at [phone] with questions or concerns regarding your invoice.  ° °IF you received labwork today, you will receive an invoice from [facility]. Please contact [facility] at [phone] with questions or concerns regarding your invoice.  ° °Our billing staff will not be able to assist you with questions regarding bills from these companies. ° °You will be contacted with the lab results as soon as they are available. The fastest way to get your results is to activate your My Chart account. Instructions are located on the last page of this paperwork. If you have not heard from us regarding the results in 2 weeks, please contact this office. °  ° ° ° °

## 2019-03-12 NOTE — Progress Notes (Signed)
Med refill of BP and new CPAP. His old one is broken and has had it for over 6-7 years.  (pt knows and is willing to get another sleep study)

## 2019-03-13 ENCOUNTER — Other Ambulatory Visit: Payer: Self-pay

## 2019-03-13 ENCOUNTER — Ambulatory Visit: Payer: BC Managed Care – PPO

## 2019-03-13 DIAGNOSIS — R7303 Prediabetes: Secondary | ICD-10-CM

## 2019-03-13 DIAGNOSIS — E785 Hyperlipidemia, unspecified: Secondary | ICD-10-CM

## 2019-03-13 DIAGNOSIS — I10 Essential (primary) hypertension: Secondary | ICD-10-CM

## 2019-03-13 NOTE — Progress Notes (Unsigned)
Nurse visit for labs only

## 2019-03-14 LAB — COMPREHENSIVE METABOLIC PANEL
ALT: 25 IU/L (ref 0–44)
AST: 23 IU/L (ref 0–40)
Albumin/Globulin Ratio: 1.2 (ref 1.2–2.2)
Albumin: 4.3 g/dL (ref 3.8–4.8)
Alkaline Phosphatase: 89 IU/L (ref 39–117)
BUN/Creatinine Ratio: 11 (ref 10–24)
BUN: 11 mg/dL (ref 8–27)
Bilirubin Total: 0.6 mg/dL (ref 0.0–1.2)
CO2: 26 mmol/L (ref 20–29)
Calcium: 9.2 mg/dL (ref 8.6–10.2)
Chloride: 100 mmol/L (ref 96–106)
Creatinine, Ser: 0.98 mg/dL (ref 0.76–1.27)
GFR calc Af Amer: 94 mL/min/{1.73_m2} (ref >59)
GFR calc non Af Amer: 81 mL/min/{1.73_m2} (ref >59)
Globulin, Total: 3.5 g/dL (ref 1.5–4.5)
Glucose: 95 mg/dL (ref 65–99)
Potassium: 4.5 mmol/L (ref 3.5–5.2)
Sodium: 138 mmol/L (ref 134–144)
Total Protein: 7.8 g/dL (ref 6.0–8.5)

## 2019-03-14 LAB — HEMOGLOBIN A1C
Est. average glucose Bld gHb Est-mCnc: 117 mg/dL
Hgb A1c MFr Bld: 5.7 % — ABNORMAL HIGH (ref 4.8–5.6)

## 2019-03-14 LAB — LIPID PANEL
Chol/HDL Ratio: 4.8 ratio (ref 0.0–5.0)
Cholesterol, Total: 220 mg/dL — ABNORMAL HIGH (ref 100–199)
HDL: 46 mg/dL (ref >39)
LDL Chol Calc (NIH): 163 mg/dL — ABNORMAL HIGH (ref 0–99)
Triglycerides: 63 mg/dL (ref 0–149)
VLDL Cholesterol Cal: 11 mg/dL (ref 5–40)

## 2019-03-20 ENCOUNTER — Telehealth: Payer: Self-pay | Admitting: Neurology

## 2019-03-20 ENCOUNTER — Institutional Professional Consult (permissible substitution): Payer: BC Managed Care – PPO | Admitting: Neurology

## 2019-03-20 NOTE — Telephone Encounter (Signed)
Pt was a no show to sleep consult apt

## 2019-03-27 ENCOUNTER — Encounter: Payer: Self-pay | Admitting: Neurology

## 2019-03-27 ENCOUNTER — Other Ambulatory Visit: Payer: Self-pay

## 2019-03-27 ENCOUNTER — Ambulatory Visit: Payer: BC Managed Care – PPO | Admitting: Neurology

## 2019-03-27 VITALS — BP 167/89 | HR 65 | Temp 97.7°F | Ht 68.0 in | Wt 210.0 lb

## 2019-03-27 DIAGNOSIS — E669 Obesity, unspecified: Secondary | ICD-10-CM | POA: Diagnosis not present

## 2019-03-27 DIAGNOSIS — G4733 Obstructive sleep apnea (adult) (pediatric): Secondary | ICD-10-CM | POA: Diagnosis not present

## 2019-03-27 DIAGNOSIS — G4719 Other hypersomnia: Secondary | ICD-10-CM

## 2019-03-27 DIAGNOSIS — R519 Headache, unspecified: Secondary | ICD-10-CM

## 2019-03-27 NOTE — Patient Instructions (Signed)

## 2019-03-27 NOTE — Progress Notes (Signed)
Subjective:    Patient ID: Cesar [name] is a 63 y.o. male.  HPI     Star Age, MD, PhD [facility] [address] [place], [place] [address]  Dear Dr. Carlota Raspberry, I saw your patient, Cesar [name], upon your kind request to my sleep clinic today for initial consultation of his sleep disorder, in particular, evaluation of his prior diagnosis of obstructive sleep apnea.  The patient is unaccompanied today.  As you know, Cesar [name] is a 63 year old right-handed gentleman with an underlying medical history of low testosterone, hypertension, arthritis, suspected TIA in the past (he had seen Dr. Jannifer Franklin on 06/14/14) and mild obesity, who was previously diagnosed with obstructive sleep apnea and placed on CPAP therapy.  He had a split-night sleep study on 02/14/2011 which was interpreted by Dr. Danton Sewer.  His AHI was 79/h, O2 nadir 88%.  He was titrated to a pressure of 12 cm at the time.  I reviewed his CPAP compliance download between 01/11/2019 and 02/09/2019, during which time he used his machine 24 days with percent use days greater than 4 hours at 3.3% only, average usage for days on treatment only 26 minutes and 42 seconds, pressure at 12 cm he reports that his CPAP machine broke.  He has not been able to consistently use it since July 2020.  He took it with him when he took a trip to [place] in July.  He reports occasional morning headaches but no night to night nocturia.  His Epworth sleepiness score is 15 out of 24, fatigue severity score is 17 out of 63.   I reviewed your telemedicine note from 03/12/2019.  His CPAP machine is the original machine from 8 years ago.  He has lost weight since his original sleep apnea diagnosis.  Current weight is approximately 5 pounds less then his weight at the time of sleep study.  He is a Airline pilot at [employer].  His bedtime is around midnight, rise time around 630.  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  He drinks caffeine in the form of coffee, 1 large cup per day.  He is not aware of any family history of OSA.  He had sinus surgery some years ago.  He has a family history of stroke.  He had a tonsillectomy in second grade.  He lives with his wife and 57-year-old adopted child and 58 year old adopted child, they also have a 60 year old they adopted.  He has a biological daughter who is 66 year old physical therapist in [place], he has a son age 66.  His Past Medical History Is Significant For: Past Medical History:  Diagnosis Date  . Arthritis    "shoulders" (04/23/2014)  . Bursitis of shoulder   . HYPERTENSION 01/15/2008  . Hypertension   . OSA on CPAP   . SHOULDER, PAIN 01/15/2008  . Sinus headache    "take OTC medications prn"  . Tendonitis of shoulder    "both"  . TESTOSTERONE DEFICIENCY 05/21/2008  . TIA (transient ischemic attack) 04/23/2014   "that's what they think I've had" (04/23/2014)    His Past Surgical History Is Significant For: Past Surgical History:  Procedure Laterality Date  . APPENDECTOMY  1978  . INGUINAL HERNIA REPAIR Left 1973  . KNEE ARTHROSCOPY WITH PATELLAR TENDON REPAIR Left 12/21/2012   Procedure: LEFT KNEE ARTHROSCOPY WITH MEDIAL and LATERALMENISECTOMY AND QUARDRICEP TENDON REPAIR;  Surgeon: Ninetta Lights, MD;  Location: [facility];  Service: Orthopedics;  Laterality: Left;  . PLANTAR FASCIA SURGERY Left 7/10   heel  . TONSILLECTOMY  1960's    His Family History Is Significant For: Family History  Problem Relation Age of Onset  . Hypertension Mother   . Stroke Father   . Hypertension Sister   . Colon cancer Neg Hx   . Stomach cancer Neg Hx     His Social History Is Significant For: Social History   Socioeconomic History  . Marital status: Married    Spouse name: Not on file  . Number of children: 5  . Years of education: Not on file  . Highest education level: Not on file   Occupational History  . Occupation: Pharmacist, hospital  Social Needs  . Financial resource strain: Not on file  . Food insecurity    Worry: Not on file    Inability: Not on file  . Transportation needs    Medical: Not on file    Non-medical: Not on file  Tobacco Use  . Smoking status: Never Smoker  . Smokeless tobacco: Never Used  Substance and Sexual Activity  . Alcohol use: No  . Drug use: No  . Sexual activity: Yes  Lifestyle  . Physical activity    Days per week: Not on file    Minutes per session: Not on file  . Stress: Not on file  Relationships  . Social Herbalist on phone: Not on file    Gets together: Not on file    Attends religious service: Not on file    Active member of club or organization: Not on file    Attends meetings of clubs or organizations: Not on file    Relationship status: Not on file  Other Topics Concern  . Not on file  Social History Narrative   Patient is right handed.   Patient drinks 1 cup caffeine daily    His Allergies Are:  Allergies  Allergen Reactions  . Codeine Nausea And Vomiting  :   His Current Medications Are:  Outpatient Encounter Medications as of 03/27/2019  Medication Sig  . aspirin EC 81 MG EC tablet Take 1 tablet (81 mg total) by mouth daily.  . Chlorpheniramine-Phenylephrine (SINUS & ALLERGY PO) Take 1 tablet by mouth 2 (two) times daily as needed (allergies).  . NON FORMULARY Place 1 Device into the nose at bedtime. CPAP  . lisinopril-hydrochlorothiazide (ZESTORETIC) 20-12.5 MG tablet Take 0.5 tablets by mouth daily. (Patient not taking: Reported on 03/27/2019)   No facility-administered encounter medications on file as of 03/27/2019.   :  Review of Systems:  Out of a complete 14 point review of systems, all are reviewed and negative with the exception of these symptoms as listed below: Review of Systems  Neurological:       Pt presents today to discuss his sleep. Pt had a sleep study about 8 years ago and  started cpap. He uses [employer] for supplies. He reports that his cpap is broken.  Epworth Sleepiness Scale 0= would never doze 1= slight chance of dozing 2= moderate chance of dozing 3= high chance of dozing  Sitting and reading: 1 Watching TV: 3 Sitting inactive in a public place (ex. Theater or meeting): 1 As a passenger in a car for an hour without a break: 3 Lying down to rest in the afternoon: 2 Sitting and talking to someone: 1 Sitting quietly after lunch (no alcohol): 3 In a car,  while stopped in traffic: 1 Total: 15     Objective:  Neurological Exam  Physical Exam Physical Examination:   Vitals:   03/27/19 1327  BP: (!) 167/89  Pulse: 65  Temp: 97.7 F (36.5 C)    General Examination: The patient is a very pleasant 63 y.o. male in no acute distress. He appears well-developed and well-nourished and well groomed.   HEENT: Normocephalic, atraumatic, pupils are equal, round and reactive to light and accommodation. Extraocular tracking is good without limitation to gaze excursion or nystagmus noted. Normal smooth pursuit is noted. Hearing is grossly intact. Face is symmetric with normal facial animation and normal facial sensation. Speech is clear with no dysarthria noted. There is no hypophonia. There is no lip, neck/head, jaw or voice tremor. Neck is supple with full range of passive and active motion. There are no carotid bruits on auscultation. Oropharynx exam reveals: mild mouth dryness, adequate dental hygiene and moderate airway crowding, due to Small airway entry and wider uvula, tonsils are absent, Mallampati is class II.  Tongue protrudes centrally in palate elevates symmetrically.  Neck circumference is 16 7/8 in.   Chest: Clear to auscultation without wheezing, rhonchi or crackles noted.  Heart: S1+S2+0, regular and normal without murmurs, rubs or gallops noted.   Abdomen: Soft, non-tender and non-distended with normal bowel sounds appreciated on  auscultation.  Extremities: There is no pitting edema in the distal lower extremities bilaterally.   Skin: Warm and dry without trophic changes noted.  Musculoskeletal: exam reveals no obvious joint deformities, tenderness or joint swelling or erythema.   Neurologically:  Mental status: The patient is awake, alert and oriented in all 4 spheres. His immediate and remote memory, attention, language skills and fund of knowledge are appropriate. There is no evidence of aphasia, agnosia, apraxia or anomia. Speech is clear with normal prosody and enunciation. Thought process is linear. Mood is normal and affect is normal.  Cranial nerves II - XII are as described above under HEENT exam. In addition: shoulder shrug is normal with equal shoulder height noted. Motor exam: Normal bulk, strength and tone is noted. There is no tremor. Romberg is negative. Fine motor skills and coordination: intact with normal finger taps, normal hand movements, normal rapid alternating patting, normal foot taps and normal foot agility.  Cerebellar testing: No dysmetria or intention tremor on finger to nose testing. Heel to shin is unremarkable bilaterally. There is no truncal or gait ataxia.  Sensory exam: intact to light touch in the upper and lower extremities.  Gait, station and balance: He stands easily. No veering to one side is noted. No leaning to one side is noted. Posture is age-appropriate and stance is narrow based. Gait shows normal stride length and normal pace. No problems turning are noted. Tandem walk is slightly challenging.   Assessment and Plan:  In summary, JAYLON LIMBACHER is a very pleasant 64 y.o.-year old male with an underlying medical history of low testosterone, hypertension, arthritis, suspected TIA in the past (he had seen Dr. Jannifer Franklin on 06/14/14) and mild obesity, who Presents for evaluation of his obstructive sleep apnea.  He was diagnosed with severe obstructive sleep apnea in September 2012.  He  still has the original CPAP machine, set at 12 cm.  His machine is defective.  He would benefit from reevaluation with a sleep study, not just to update his diagnoses but also to qualify for new equipment.  I talked to the patient about the diagnosis of OSA, its prognosis  and treatment options. He would be willing to continue with CPAP therapy.  He would be willing to proceed with sleep study testing.  I plan to see him back after his sleep study. He does report his machine still turns on.  He is advised to use it as best as possible in the interim.  We talked about the importance of treating sleep apnea.  We also talked about the importance of keeping a good sleep schedule.  He is commended on his weight loss endeavors and encouraged to continue to work on it. I answered all his questions today and he was in agreement  Thank you very much for allowing me to participate in the care of this nice patient. If I can be of any further assistance to you please do not hesitate to call me at [phone].  Sincerely,   Star Age, MD, PhD

## 2019-04-05 ENCOUNTER — Other Ambulatory Visit: Payer: Self-pay

## 2019-04-05 DIAGNOSIS — Z20822 Contact with and (suspected) exposure to covid-19: Secondary | ICD-10-CM

## 2019-04-06 LAB — NOVEL CORONAVIRUS, NAA: SARS-CoV-2, NAA: NOT DETECTED

## 2019-05-06 ENCOUNTER — Ambulatory Visit (INDEPENDENT_AMBULATORY_CARE_PROVIDER_SITE_OTHER): Payer: BC Managed Care – PPO | Admitting: Neurology

## 2019-05-06 DIAGNOSIS — G4733 Obstructive sleep apnea (adult) (pediatric): Secondary | ICD-10-CM | POA: Diagnosis not present

## 2019-05-06 DIAGNOSIS — E669 Obesity, unspecified: Secondary | ICD-10-CM

## 2019-05-06 DIAGNOSIS — R519 Headache, unspecified: Secondary | ICD-10-CM

## 2019-05-06 DIAGNOSIS — G4719 Other hypersomnia: Secondary | ICD-10-CM

## 2019-05-06 DIAGNOSIS — G4720 Circadian rhythm sleep disorder, unspecified type: Secondary | ICD-10-CM

## 2019-05-28 NOTE — Procedures (Signed)
PATIENT'S NAME:  Cesar [name], Cesar [name] DOB:      1955/07/19      MR#:    [id number]     DATE OF RECORDING: 05/06/2019 REFERRING M.D.:  [name], MD Study Performed:   Baseline Polysomnogram HISTORY: 63 year old man with a history of low testosterone, hypertension, arthritis, suspected TIA in the past (he had seen Dr. Jannifer Franklin on 06/14/14) and mild obesity, who was previously diagnosed with obstructive sleep apnea and placed on CPAP therapy. He had a split-night sleep study on 02/14/2011 which was interpreted by Dr. Danton Sewer.  His AHI was 79/h, O2 nadir 88%.  He was titrated to a pressure of 12 cm at the time. His machine is over 26 years old. The patient endorsed the Epworth Sleepiness Scale at 15/24 points. The patient's weight 210 pounds with a height of 68 (inches), resulting in a BMI of 31.7 kg/m2. The patient's neck circumference measured 16.8 inches.  CURRENT MEDICATIONS: Aspirin, Zestoretic.   PROCEDURE:  This is a multichannel digital polysomnogram utilizing the Somnostar 11.2 system.  Electrodes and sensors were applied and monitored per AASM Specifications.   EEG, EOG, Chin and Limb EMG, were sampled at 200 Hz.  ECG, Snore and Nasal Pressure, Thermal Airflow, Respiratory Effort, CPAP Flow and Pressure, Oximetry was sampled at 50 Hz. Digital video and audio were recorded.      BASELINE STUDY  Lights Out was at 21:56 and Lights On at 04:59.  Total recording time (TRT) was 423.5 minutes, with a total sleep time (TST) of 306 minutes.   The patient's sleep latency to persistent sleep was 71.5 minutes, which is delayed. REM latency was 138.5 minutes, which is delayed. The sleep efficiency was 72.3 %.     SLEEP ARCHITECTURE: WASO (Wake after sleep onset) was 99 minutes with overall mild sleep fragmentation noted and one longer period of wakefulness. There were 5.5 minutes in Stage N1, 241 minutes Stage N2, 10.5 minutes Stage N3 and 49 minutes in Stage REM.  The percentage of Stage N1 was 1.8%, Stage  N2 was 78.8%, which is markedly increased, Stage N3 was 3.4% and Stage R (REM sleep) was 16.%, which is mildly reduced. The arousals were noted as: 36 were spontaneous, 0 were associated with PLMs, 91 were associated with respiratory events.  RESPIRATORY ANALYSIS:  There were a total of 249 respiratory events:  7 obstructive apneas, 0 central apneas and 0 mixed apneas with a total of 7 apneas and an apnea index (AI) of 1.4 /hour. There were 242 hypopneas with a hypopnea index of 47.5 /hour. The patient also had 0 respiratory event related arousals (RERAs).      The total APNEA/HYPOPNEA INDEX (AHI) was 48.8/hour and the total RESPIRATORY DISTURBANCE INDEX was  48.8 /hour.  28 events occurred in REM sleep and 430 events in NREM. The REM AHI was  34.3 /hour, versus a non-REM AHI of 51.6. The patient spent 223.5 minutes of total sleep time in the supine position and 83 minutes in non-supine.. The supine AHI was 59.3 versus a non-supine AHI of 20.4.  OXYGEN SATURATION & C02:  The Wake baseline 02 saturation was 92%, with the lowest being 82%. Time spent below 89% saturation equaled 30 minutes.  PERIODIC LIMB MOVEMENTS: The patient had a total of 0 Periodic Limb Movements.  The Periodic Limb Movement (PLM) index was 0 and the PLM Arousal index was 0/hour.  Audio and video analysis did not show any abnormal or unusual movements, behaviors, phonations or vocalizations. The  patient took 1 bathroom break. Mild to moderate snoring was noted. The EKG was in keeping with normal sinus rhythm (NSR). Post-study, the patient indicated that sleep was the same as usual.   IMPRESSION:  1. Severe Obstructive Sleep Apnea (OSA) 2. Dysfunctions associated with sleep stages or arousal from sleep  RECOMMENDATIONS:  1. This study demonstrates (confirms) severe obstructive sleep apnea, with a total AHI of 48.8/hour, REM AHI of 34.3/hour, supine AHI of 59.3/hour and O2 nadir of 82%. Treatment with positive airway pressure in  the form of CPAP is recommended. This will require a full night titration study to optimize therapy. Other treatment options may include avoidance of supine sleep position along with weight loss, upper airway or jaw surgery in selected patients or the use of an oral appliance in certain patients. ENT evaluation and/or consultation with a maxillofacial surgeon or dentist may be feasible in some instances.    2. Please note that untreated obstructive sleep apnea may carry additional perioperative morbidity. Patients with significant obstructive sleep apnea should receive perioperative PAP therapy and the surgeons and particularly the anesthesiologist should be informed of the diagnosis and the severity of the sleep disordered breathing. 3. This study shows sleep fragmentation and abnormal sleep stage percentages; these are nonspecific findings and per se do not signify an intrinsic sleep disorder or a cause for the patient's sleep-related symptoms. Causes include (but are not limited to) the first night effect of the sleep study, circadian rhythm disturbances, medication effect or an underlying mood disorder or medical problem.  4. The patient should be cautioned not to drive, work at heights, or operate dangerous or heavy equipment when tired or sleepy. Review and reiteration of good sleep hygiene measures should be pursued with any patient. 5. The patient will be seen in follow-up in the sleep clinic at [facility] for discussion of the test results, symptom and treatment compliance review, further management strategies, etc. The referring provider will be notified of the test results.  I certify that I have reviewed the entire raw data recording prior to the issuance of this report in accordance with the Standards of Accreditation of the American Academy of Sleep Medicine (AASM)  Star Age, MD, PhD Diplomat, American Board of Neurology and Sleep Medicine (Neurology and Sleep Medicine)

## 2019-05-28 NOTE — Addendum Note (Signed)
Addended by: Star Age on: 05/28/2019 12:03 PM   Modules accepted: Orders

## 2019-05-29 ENCOUNTER — Encounter: Payer: Self-pay | Admitting: Neurology

## 2019-05-29 ENCOUNTER — Telehealth: Payer: Self-pay | Admitting: Neurology

## 2019-05-29 NOTE — Telephone Encounter (Signed)
-----   Message from Star Age, MD sent at 05/28/2019 12:03 PM EST ----- Patient referred by Dr. Carlota Raspberry for re-eval of his OSA, seen by me on 03/27/19, diagnostic PSG on 05/06/19.   Please call and notify the patient that the recent sleep study showed/confirms severe obstructive sleep apnea. I recommend treatment for this in the form of CPAP. This will require a repeat sleep study for proper titration and mask fitting and correct monitoring of the oxygen saturations. Please explain to patient. I have placed an order in the chart. Thanks.  Star Age, MD, PhD [facility])

## 2019-05-29 NOTE — Telephone Encounter (Signed)
Called patient to discuss sleep study results. No answer at this time. LVM for the patient to call back.  I will send a message to the patient on mychart as well.

## 2019-06-13 ENCOUNTER — Other Ambulatory Visit: Payer: Self-pay

## 2019-06-13 ENCOUNTER — Encounter: Payer: Self-pay | Admitting: Family Medicine

## 2019-06-13 ENCOUNTER — Ambulatory Visit: Payer: BC Managed Care – PPO | Admitting: Family Medicine

## 2019-06-13 ENCOUNTER — Encounter: Payer: Self-pay | Admitting: Internal Medicine

## 2019-06-13 VITALS — BP 163/96 | HR 74 | Temp 98.9°F | Ht 68.0 in | Wt 211.6 lb

## 2019-06-13 DIAGNOSIS — E785 Hyperlipidemia, unspecified: Secondary | ICD-10-CM | POA: Diagnosis not present

## 2019-06-13 DIAGNOSIS — R7303 Prediabetes: Secondary | ICD-10-CM | POA: Diagnosis not present

## 2019-06-13 DIAGNOSIS — I10 Essential (primary) hypertension: Secondary | ICD-10-CM

## 2019-06-13 DIAGNOSIS — G4733 Obstructive sleep apnea (adult) (pediatric): Secondary | ICD-10-CM

## 2019-06-13 DIAGNOSIS — Z9989 Dependence on other enabling machines and devices: Secondary | ICD-10-CM

## 2019-06-13 DIAGNOSIS — Z1211 Encounter for screening for malignant neoplasm of colon: Secondary | ICD-10-CM

## 2019-06-13 MED ORDER — HYDROCHLOROTHIAZIDE 12.5 MG PO TABS: 6.2500 mg | ORAL_TABLET | Freq: Every day | ORAL | 1 refills | Status: DC

## 2019-06-13 NOTE — Progress Notes (Signed)
Subjective:  Patient ID: Cesar [name], male    DOB: August 04, 1955  Age: 64 y.o. MRN: [id number]  CC:  Chief Complaint  Patient presents with  . Follow-up    3 mopnth follow up on lab work and medical conditions. no promlems with pt's current medical conditions.   . Referral    pt needs a referral for colonoscopy.    HPI [name] presents for   Obstructive sleep apnea Treated by Dr. Rexene Alberts, AHI 48, supine AHI 59, O2 nadir 82% on sleep study in December.  Titration planned on 1/25. No recent cpap use.   Prediabetes Exercise - activity at home.  Wt Readings from Last 3 Encounters:  06/13/19 211 lb 9.6 oz (96 kg)  03/27/19 210 lb (95.3 kg)  02/07/18 217 lb 12.8 oz (98.8 kg)   Lab Results  Component Value Date   HGBA1C 5.7 (H) 03/13/2019    Hypertension: Lisinopril HCTZ 20/12.5 mg half pill daily, reported controlled at October telemedicine visit. Has been off meds. Dis not restart meds as eyes would turn bloodshot, lightheadedness spells, resolved with sitting down. BP low 100's over 70 at the time. No spells since stopping medicine.  Home readings: 138/87.  BP Readings from Last 3 Encounters:  06/13/19 (!) 163/96  03/27/19 (!) 167/89  02/07/18 134/80   Lab Results  Component Value Date   CREATININE 0.98 03/13/2019   Hyperlipidemia: Statin vs diet changes at telemed visit. Some improved diet except holidays.   The 10-year ASCVD risk score [name] DC [name]., et al., 2013) is: 24.7%   Values used to calculate the score:     Age: 68 years     Sex: Male     Is Non-Hispanic African American: Yes     Diabetic: No     Tobacco smoker: No     Systolic Blood Pressure: XX123456 mmHg     Is BP treated: Yes     HDL Cholesterol: 46 mg/dL     Total Cholesterol: 220 mg/dL   Lab Results  Component Value Date   CHOL 220 (H) 03/13/2019   HDL 46 03/13/2019   LDLCALC 163 (H) 03/13/2019   LDLDIRECT 158.5 12/17/2009   TRIG 63 03/13/2019   CHOLHDL 4.8 03/13/2019   Lab Results    Component Value Date   ALT 25 03/13/2019   AST 23 03/13/2019   ALKPHOS 89 03/13/2019   BILITOT 0.6 03/13/2019      History Patient Active Problem List   Diagnosis Date Noted  . Hyperlipidemia 04/15/2016  . Right bundle branch block 04/15/2016  . Sleep apnea syndrome 12/29/2010  . DIZZINESS 07/15/2010  . Dyslipidemia 07/14/2010  . TESTOSTERONE DEFICIENCY 05/21/2008  . Essential hypertension 01/15/2008  . SHOULDER, PAIN 01/15/2008   Past Medical History:  Diagnosis Date  . Arthritis    "shoulders" (04/23/2014)  . Bursitis of shoulder   . HYPERTENSION 01/15/2008  . Hypertension   . OSA on CPAP   . SHOULDER, PAIN 01/15/2008  . Sinus headache    "take OTC medications prn"  . Tendonitis of shoulder    "both"  . TESTOSTERONE DEFICIENCY 05/21/2008  . TIA (transient ischemic attack) 04/23/2014   "that's what they think I've had" (04/23/2014)   Past Surgical History:  Procedure Laterality Date  . APPENDECTOMY  1978  . INGUINAL HERNIA REPAIR Left 1973  . KNEE ARTHROSCOPY WITH PATELLAR TENDON REPAIR Left 12/21/2012   Procedure: LEFT KNEE ARTHROSCOPY WITH MEDIAL and LATERALMENISECTOMY AND QUARDRICEP TENDON REPAIR;  Surgeon:  Ninetta Lights, MD;  Location: [facility];  Service: Orthopedics;  Laterality: Left;  . PLANTAR FASCIA SURGERY Left 7/10   heel  . TONSILLECTOMY  1960's   Allergies  Allergen Reactions  . Codeine Nausea And Vomiting   Prior to Admission medications   Medication Sig Start Date End Date Taking? Authorizing Provider  aspirin EC 81 MG EC tablet Take 1 tablet (81 mg total) by mouth daily. 04/24/14  Yes [name], [name], MD  Chlorpheniramine-Phenylephrine (SINUS & ALLERGY PO) Take 1 tablet by mouth 2 (two) times daily as needed (allergies).   Yes [provider]  NON FORMULARY Place 1 Device into the nose at bedtime. CPAP   Yes [provider]  lisinopril-hydrochlorothiazide (ZESTORETIC) 20-12.5 MG tablet Take 0.5 tablets by  mouth daily. Patient not taking: Reported on 06/13/2019 03/12/19   [name], MD   Social History   Socioeconomic History  . Marital status: Married    Spouse name: Not on file  . Number of children: 5  . Years of education: Not on file  . Highest education level: Not on file  Occupational History  . Occupation: Pharmacist, hospital  Tobacco Use  . Smoking status: Never Smoker  . Smokeless tobacco: Never Used  Substance and Sexual Activity  . Alcohol use: No  . Drug use: No  . Sexual activity: Yes  Other Topics Concern  . Not on file  Social History Narrative   Patient is right handed.   Patient drinks 1 cup caffeine daily   Social Determinants of Health   Financial Resource Strain:   . Difficulty of Paying Living Expenses: Not on file  Food Insecurity:   . Worried About Charity fundraiser in the Last Year: Not on file  . Ran Out of Food in the Last Year: Not on file  Transportation Needs:   . Lack of Transportation (Medical): Not on file  . Lack of Transportation (Non-Medical): Not on file  Physical Activity:   . Days of Exercise per Week: Not on file  . Minutes of Exercise per Session: Not on file  Stress:   . Feeling of Stress : Not on file  Social Connections:   . Frequency of Communication with Friends and Family: Not on file  . Frequency of Social Gatherings with Friends and Family: Not on file  . Attends Religious Services: Not on file  . Active Member of Clubs or Organizations: Not on file  . Attends Archivist Meetings: Not on file  . Marital Status: Not on file  Intimate Partner Violence:   . Fear of Current or Ex-Partner: Not on file  . Emotionally Abused: Not on file  . Physically Abused: Not on file  . Sexually Abused: Not on file    Review of Systems  Constitutional: Negative for fatigue and unexpected weight change.  Eyes: Negative for visual disturbance.  Respiratory: Negative for cough, chest tightness and shortness of breath.     Cardiovascular: Negative for chest pain, palpitations and leg swelling.  Gastrointestinal: Negative for abdominal pain and blood in stool.  Neurological: Negative for dizziness, light-headedness and headaches.     Objective:   Vitals:   06/13/19 1016 06/13/19 1023  BP: (!) 163/96 (!) 163/96  Pulse: 74   Temp: 98.9 F (37.2 C)   TempSrc: Temporal   SpO2: 99%   Weight: 211 lb 9.6 oz (96 kg)   Height: 5\' 8"  (1.727 m)      Physical Exam  Vitals reviewed.  Constitutional:      Appearance: He is well-developed.  HENT:     Head: Normocephalic and atraumatic.  Eyes:     Pupils: Pupils are equal, round, and reactive to light.  Neck:     Vascular: No carotid bruit or JVD.  Cardiovascular:     Rate and Rhythm: Normal rate and regular rhythm.     Heart sounds: Normal heart sounds. No murmur.  Pulmonary:     Effort: Pulmonary effort is normal.     Breath sounds: Normal breath sounds. No rales.  Skin:    General: Skin is warm and dry.  Neurological:     Mental Status: He is alert and oriented to person, place, and time.        Assessment & Plan:  KAYEDEN VANDERSLUIS is a 64 y.o. male . Hyperlipidemia, unspecified hyperlipidemia type - Plan: Comprehensive metabolic panel, Lipid panel  -Elevated ASCVD risk previous labs.  Repeat lab, potential need for statin discussed.  OSA on CPAP  -Plan for CPAP titration as above.  Anticipate this to also improve his blood pressure  Prediabetes - Plan: Hemoglobin A1c  -Continue to watch diet, activity/exercise, recheck A1c  Essential hypertension - Plan: hydrochlorothiazide (HYDRODIURIL) 12.5 MG tablet  -Elevated off meds, possible orthostatic symptoms previously.  Will decrease regimen to just hydrochlorothiazide 6.25 mg daily initially, and anticipate improvement with CPAP use.  Recheck in 1 month RTC precautions.  Return of previous dizziness or other new symptoms.  Special screening for malignant neoplasms, colon - Plan: Ambulatory  referral to Gastroenterology   Meds ordered this encounter  Medications  . hydrochlorothiazide (HYDRODIURIL) 12.5 MG tablet    Sig: Take 0.5 tablets (6.25 mg total) by mouth daily.    Dispense:  45 tablet    Refill:  1   Patient Instructions   Try restartingjust hctz 12.5mg  -- 1/2 pill per day. Starting CPAP will likely help with blood pressure.  Recheck 1 month - telemedicine is fine if needed - to recheck blood pressure.  Return to the clinic or go to the nearest emergency room if any of your symptoms worsen or new symptoms occur.  Based on last cholesterol readings, likely will recommend statin but I will check those levels again today.  Thank you for coming in today and I will see you in 1 month.   If you have lab work done today you will be contacted with your lab results within the next 2 weeks.  If you have not heard from [place] then please contact us. The fastest way to get your results is to register for My Chart.   IF you received an x-ray today, you will receive an invoice from [facility]. Please contact [facility] at [phone] with questions or concerns regarding your invoice.   IF you received labwork today, you will receive an invoice from [place]. Please contact [facility] at [phone] with questions or concerns regarding your invoice.   Our billing staff will not be able to assist you with questions regarding bills from these companies.  You will be contacted with the lab results as soon as they are available. The fastest way to get your results is to activate your My Chart account. Instructions are located on the last page of this paperwork. If you have not heard from [place] regarding the results in 2 weeks, please contact this office.         Signed, [name], MD [facility]

## 2019-06-13 NOTE — Patient Instructions (Addendum)
Try restartingjust hctz 12.5mg  -- 1/2 pill per day. Starting CPAP will likely help with blood pressure.  Recheck 1 month - telemedicine is fine if needed - to recheck blood pressure.  Return to the clinic or go to the nearest emergency room if any of your symptoms worsen or new symptoms occur.  Based on last cholesterol readings, likely will recommend statin but I will check those levels again today.  Thank you for coming in today and I will see you in 1 month.   If you have lab work done today you will be contacted with your lab results within the next 2 weeks.  If you have not heard from [place] then please contact us. The fastest way to get your results is to register for My Chart.   IF you received an x-ray today, you will receive an invoice from [facility]. Please contact [facility] at [phone] with questions or concerns regarding your invoice.   IF you received labwork today, you will receive an invoice from [place]. Please contact [facility] at [phone] with questions or concerns regarding your invoice.   Our billing staff will not be able to assist you with questions regarding bills from these companies.  You will be contacted with the lab results as soon as they are available. The fastest way to get your results is to activate your My Chart account. Instructions are located on the last page of this paperwork. If you have not heard from [place] regarding the results in 2 weeks, please contact this office.

## 2019-06-14 LAB — COMPREHENSIVE METABOLIC PANEL
ALT: 24 IU/L (ref 0–44)
AST: 28 IU/L (ref 0–40)
Albumin/Globulin Ratio: 1.4 (ref 1.2–2.2)
Albumin: 4.2 g/dL (ref 3.8–4.8)
Alkaline Phosphatase: 84 IU/L (ref 39–117)
BUN/Creatinine Ratio: 11 (ref 10–24)
BUN: 11 mg/dL (ref 8–27)
Bilirubin Total: 0.3 mg/dL (ref 0.0–1.2)
CO2: 24 mmol/L (ref 20–29)
Calcium: 9 mg/dL (ref 8.6–10.2)
Chloride: 100 mmol/L (ref 96–106)
Creatinine, Ser: 0.97 mg/dL (ref 0.76–1.27)
GFR calc Af Amer: 95 mL/min/{1.73_m2} (ref >59)
GFR calc non Af Amer: 82 mL/min/{1.73_m2} (ref >59)
Globulin, Total: 3 g/dL (ref 1.5–4.5)
Glucose: 90 mg/dL (ref 65–99)
Potassium: 4.6 mmol/L (ref 3.5–5.2)
Sodium: 138 mmol/L (ref 134–144)
Total Protein: 7.2 g/dL (ref 6.0–8.5)

## 2019-06-14 LAB — HEMOGLOBIN A1C
Est. average glucose Bld gHb Est-mCnc: 126 mg/dL
Hgb A1c MFr Bld: 6 % — ABNORMAL HIGH (ref 4.8–5.6)

## 2019-06-14 LAB — LIPID PANEL
Chol/HDL Ratio: 5 ratio (ref 0.0–5.0)
Cholesterol, Total: 208 mg/dL — ABNORMAL HIGH (ref 100–199)
HDL: 42 mg/dL (ref >39)
LDL Chol Calc (NIH): 156 mg/dL — ABNORMAL HIGH (ref 0–99)
Triglycerides: 55 mg/dL (ref 0–149)
VLDL Cholesterol Cal: 10 mg/dL (ref 5–40)

## 2019-06-23 ENCOUNTER — Other Ambulatory Visit (HOSPITAL_COMMUNITY)
Admission: RE | Admit: 2019-06-23 | Discharge: 2019-06-23 | Disposition: A | Discharge status: Home / Self Care | Payer: BC Managed Care – PPO | Source: Ambulatory Visit | Attending: Neurology | Admitting: Neurology

## 2019-06-23 DIAGNOSIS — Z20822 Contact with and (suspected) exposure to covid-19: Secondary | ICD-10-CM | POA: Diagnosis not present

## 2019-06-23 DIAGNOSIS — Z01812 Encounter for preprocedural laboratory examination: Secondary | ICD-10-CM | POA: Insufficient documentation

## 2019-06-23 LAB — SARS CORONAVIRUS 2 (TAT 6-24 HRS): SARS Coronavirus 2: NEGATIVE

## 2019-06-26 ENCOUNTER — Ambulatory Visit (INDEPENDENT_AMBULATORY_CARE_PROVIDER_SITE_OTHER): Payer: BC Managed Care – PPO | Admitting: Neurology

## 2019-06-26 DIAGNOSIS — G4733 Obstructive sleep apnea (adult) (pediatric): Secondary | ICD-10-CM

## 2019-06-26 DIAGNOSIS — R519 Headache, unspecified: Secondary | ICD-10-CM

## 2019-06-26 DIAGNOSIS — E669 Obesity, unspecified: Secondary | ICD-10-CM

## 2019-06-26 DIAGNOSIS — G4720 Circadian rhythm sleep disorder, unspecified type: Secondary | ICD-10-CM

## 2019-06-26 DIAGNOSIS — G4719 Other hypersomnia: Secondary | ICD-10-CM

## 2019-06-29 ENCOUNTER — Other Ambulatory Visit: Payer: Self-pay

## 2019-06-29 ENCOUNTER — Telehealth (INDEPENDENT_AMBULATORY_CARE_PROVIDER_SITE_OTHER): Payer: BC Managed Care – PPO | Admitting: Family Medicine

## 2019-06-29 ENCOUNTER — Encounter: Payer: Self-pay | Admitting: Family Medicine

## 2019-06-29 ENCOUNTER — Ambulatory Visit (AMBULATORY_SURGERY_CENTER): Payer: Self-pay | Admitting: *Deleted

## 2019-06-29 VITALS — Ht 68.0 in | Wt 207.0 lb

## 2019-06-29 VITALS — Temp 96.6°F | Ht 68.0 in | Wt 209.0 lb

## 2019-06-29 DIAGNOSIS — Z01818 Encounter for other preprocedural examination: Secondary | ICD-10-CM

## 2019-06-29 DIAGNOSIS — R7303 Prediabetes: Secondary | ICD-10-CM | POA: Diagnosis not present

## 2019-06-29 DIAGNOSIS — E785 Hyperlipidemia, unspecified: Secondary | ICD-10-CM | POA: Diagnosis not present

## 2019-06-29 DIAGNOSIS — I10 Essential (primary) hypertension: Secondary | ICD-10-CM | POA: Diagnosis not present

## 2019-06-29 DIAGNOSIS — Z86010 Personal history of colonic polyps: Secondary | ICD-10-CM

## 2019-06-29 MED ORDER — NA SULFATE-K SULFATE-MG SULF 17.5-3.13-1.6 GM/177ML PO SOLN: ORAL | 0 refills | Status: DC

## 2019-06-29 NOTE — Patient Instructions (Addendum)
Keep a record of your blood pressures outside of the office and if running over 140/90 - increase the hydrochlorothiazide to full pill per day. Give me an update by Mychart.  As we discussed I think starting CPAP will also be helpful for blood pressure control.  Coordinate prescription for CPAP machine with your sleep specialist as they should be able to place the appropriate orders.  Continue to work on diet and recheck diabetes test, cholesterol in the next 6 months, or as early as 3 months to determine if medication is still recommended.  Let me know if there are questions in the meantime.  Return to the clinic or go to the nearest emergency room if any of your symptoms worsen or new symptoms occur.

## 2019-06-29 NOTE — Progress Notes (Signed)
Patient is here in-person for PV. Patient denies any allergies to eggs or soy. Patient denies any problems with anesthesia/sedation. Patient denies any oxygen use at home. Patient denies taking any diet/weight loss medications or blood thinners. Patient is not being treated for MRSA or C-diff. EMMI education assisgned to the patient for the procedure, this was explained and instructions given to patient. COVID-19 screening test is on 2/9, the pt is aware. Pt is aware that care partner will wait in the car during procedure; if they feel like they will be too hot or cold to wait in the car; they may wait in the 4 th floor lobby. Patient is aware to bring only one care partner. We want them to wear a mask (we do not have any that we can provide them), practice social distancing, and we will check their temperatures when they get here.  I did remind the patient that their care partner needs to stay in the parking lot the entire time and have a cell phone available, we will call them when the pt is ready for discharge. Patient will wear mask into building.    Suprep $15 off coupon given to the patient.

## 2019-06-29 NOTE — Progress Notes (Signed)
Virtual Visit via Video Note  I connected with Cesar [name] on 06/29/19 at 6:09 PM, after 5 min chart review.  by a video enabled telemedicine application doximity and verified that I am speaking with the correct person using two identifiers.   I discussed the limitations, risks, security and privacy concerns of performing an evaluation and management service by telephone and the availability of in person appointments. I also discussed with the patient that there may be a patient responsible charge related to this service. The patient expressed understanding and agreed to proceed, consent obtained  Chief complaint:  Chief Complaint  Patient presents with  . Follow-up    Lab work from visit on 06/13/2019, and medication review. pt states he is doing well with his current medication and no current side effect. pt states he dose not need any medication refill.     History of Present Illness: Cesar [name] is a 64 y.o. male  Hypertension: Discussed at telemedicine visit January 13.  Elevated off meds at that time, did have some previous orthostatic symptoms.  Decreased hydrochlorothiazide to 6.25 mg daily initially, also anticipate improvement in blood pressure with CPAP use. Titration few days ago, will be discussing machine with sleep specisli   Prior AHI 48-59, with O2 nadir 82%.  No further orthostatic symptoms. Feels better.  Home readings: none recently.  Just found meter BP Readings from Last 3 Encounters:  06/13/19 (!) 163/96  03/27/19 (!) 167/89  02/07/18 134/80   Lab Results  Component Value Date   CREATININE 0.97 06/13/2019   Prediabetes: Most recent A1c slightly higher than few months ago.  Weight has been reportedly stable from last October, and actually lower than in 2019. Lab Results  Component Value Date   HGBA1C 6.0 (H) 06/13/2019  plans on improved diet - diet less over the holidays. cutrting back carbs now.    Hyperlipidemia: Repeat labs obtained January 13.   Not currently on statin.  Recommended with recent blood work and elevated ASCVD risk. He does not want to start meds at this time. Plans on   The 10-year ASCVD risk score Cesar [name] DC Cesar [name]., et al., 2013) is: 24.9%   Values used to calculate the score:     Age: 53 years     Sex: Male     Is Non-Hispanic African American: Yes     Diabetic: No     Tobacco smoker: No     Systolic Blood Pressure: XX123456 mmHg     Is BP treated: Yes     HDL Cholesterol: 42 mg/dL     Total Cholesterol: 208 mg/dL  Lab Results  Component Value Date   CHOL 208 (H) 06/13/2019   HDL 42 06/13/2019   LDLCALC 156 (H) 06/13/2019   LDLDIRECT 158.5 12/17/2009   TRIG 55 06/13/2019   CHOLHDL 5.0 06/13/2019   Lab Results  Component Value Date   ALT 24 06/13/2019   AST 28 06/13/2019   ALKPHOS 84 06/13/2019   BILITOT 0.3 06/13/2019        Patient Active Problem List   Diagnosis Date Noted  . Hyperlipidemia 04/15/2016  . Right bundle branch block 04/15/2016  . Sleep apnea syndrome 12/29/2010  . DIZZINESS 07/15/2010  . Dyslipidemia 07/14/2010  . TESTOSTERONE DEFICIENCY 05/21/2008  . Essential hypertension 01/15/2008  . SHOULDER, PAIN 01/15/2008   Past Medical History:  Diagnosis Date  . Arthritis    "shoulders" (04/23/2014)  . Bursitis of shoulder   . HYPERTENSION 01/15/2008  .  Hypertension   . OSA on CPAP   . SHOULDER, PAIN 01/15/2008  . Sinus headache    "take OTC medications prn"  . Sleep apnea    uses CPAP  . Tendonitis of shoulder    "both"  . TESTOSTERONE DEFICIENCY 05/21/2008  . TIA (transient ischemic attack) 04/23/2014   "that's what they think I've had" (04/23/2014)   Past Surgical History:  Procedure Laterality Date  . APPENDECTOMY  1978  . COLONOSCOPY  2014  . INGUINAL HERNIA REPAIR Left 1973  . KNEE ARTHROSCOPY WITH PATELLAR TENDON REPAIR Left 12/21/2012   Procedure: LEFT KNEE ARTHROSCOPY WITH MEDIAL and LATERALMENISECTOMY AND QUARDRICEP TENDON REPAIR;  Surgeon: Ninetta Lights, MD;   Location: [place];  Service: Orthopedics;  Laterality: Left;  . PLANTAR FASCIA SURGERY Left 7/10   heel  . POLYPECTOMY    . TONSILLECTOMY  1960's   Allergies  Allergen Reactions  . Codeine Nausea And Vomiting   Prior to Admission medications   Medication Sig Start Date End Date Taking? Authorizing Provider  aspirin EC 81 MG EC tablet Take 1 tablet (81 mg total) by mouth daily. 04/24/14  Yes [name], [name], MD  Chlorpheniramine-Phenylephrine (SINUS & ALLERGY PO) Take 1 tablet by mouth 2 (two) times daily as needed (allergies).   Yes [provider]  Na Sulfate-K Sulfate-Mg Sulf 17.5-3.13-1.6 GM/177ML SOLN Suprep (no substitutions)-TAKE AS DIRECTED. See universal coupon 06/29/19  Yes [name], [name], MD  NON FORMULARY Place 1 Device into the nose at bedtime. CPAP   Yes [provider]  hydrochlorothiazide (HYDRODIURIL) 12.5 MG tablet Take 0.5 tablets (6.25 mg total) by mouth daily. Patient not taking: Reported on 06/29/2019 06/13/19   [name], MD   Social History   Socioeconomic History  . Marital status: Married    Spouse name: Not on file  . Number of children: 5  . Years of education: Not on file  . Highest education level: Not on file  Occupational History  . Occupation: Pharmacist, hospital  Tobacco Use  . Smoking status: Never Smoker  . Smokeless tobacco: Never Used  Substance and Sexual Activity  . Alcohol use: No  . Drug use: No  . Sexual activity: Yes  Other Topics Concern  . Not on file  Social History Narrative   Patient is right handed.   Patient drinks 1 cup caffeine daily   Social Determinants of Health   Financial Resource Strain:   . Difficulty of Paying Living Expenses: Not on file  Food Insecurity:   . Worried About Charity fundraiser in the Last Year: Not on file  . Ran Out of Food in the Last Year: Not on file  Transportation Needs:   . Lack of Transportation (Medical): Not on file  . Lack of Transportation  (Non-Medical): Not on file  Physical Activity:   . Days of Exercise per Week: Not on file  . Minutes of Exercise per Session: Not on file  Stress:   . Feeling of Stress : Not on file  Social Connections:   . Frequency of Communication with Friends and Family: Not on file  . Frequency of Social Gatherings with Friends and Family: Not on file  . Attends Religious Services: Not on file  . Active Member of Clubs or Organizations: Not on file  . Attends Archivist Meetings: Not on file  . Marital Status: Not on file  Intimate Partner Violence:   . Fear of Current or Ex-Partner: Not  on file  . Emotionally Abused: Not on file  . Physically Abused: Not on file  . Sexually Abused: Not on file    Observations/Objective: Vitals:   06/29/19 1635  Weight: 207 lb (93.9 kg)  Height: 5\' 8"  (1.727 m)  Speaking normally, no distress, nontoxic appearance over video.  Appropriate responses, all questions answered.   Assessment and Plan: Prediabetes  -Improving diet, plan on repeat testing in 6 months  Hyperlipidemia, unspecified hyperlipidemia type  -Elevated ASCVD risk were discussed, declined statin at this time.  Would like to make dietary changes first, and recheck levels in 3 to 6 months.  Essential hypertension  -Improved with HCTZ 6.25 mg by report, will send some home readings by MyChart in the next week.  Option of full dose if BP over 140/90, but anticipate improvement with start of CPAP as well.  Follow Up Instructions:  6 months in office for hypertension, cholesterol, prediabetes.   I discussed the assessment and treatment plan with the patient. The patient was provided an opportunity to ask questions and all were answered. The patient agreed with the plan and demonstrated an understanding of the instructions.   The patient was advised to call back or seek an in-person evaluation if the symptoms worsen or if the condition fails to improve as anticipated.  I provided  12 minutes of non-face-to-face time during this encounter.   [name], MD

## 2019-07-04 ENCOUNTER — Telehealth: Payer: Self-pay | Admitting: Neurology

## 2019-07-04 NOTE — Telephone Encounter (Signed)
Pt said he had a sleep study on 01-26, he would like a call with the status on his CPAP

## 2019-07-05 ENCOUNTER — Encounter: Payer: Self-pay | Admitting: Internal Medicine

## 2019-07-05 NOTE — Telephone Encounter (Signed)
I reached out to the pt via my chart and advised we had not received the finalized report on the sleep study. Pt was advised once report is complete we would reach out.

## 2019-07-05 NOTE — Addendum Note (Signed)
Addended by: Star Age on: 07/05/2019 08:09 AM   Modules accepted: Orders

## 2019-07-05 NOTE — Procedures (Signed)
PATIENT'S NAME:  Cesar [name], Cesar [name] DOB:      Oct 04, 1955      MR#:    [id number]     DATE OF RECORDING: 06/26/2019 REFERRING M.D.:  [name], MD Study Performed:   CPAP  Titration HISTORY:  64 year old man with a history of low testosterone, hypertension, arthritis, suspected TIA in the past (he had seen Dr. Jannifer Franklin on 06/14/14) and mild obesity, who presents for a full night titration study. His baseline sleep study from 05/06/19 showed (confirmed) severe obstructive sleep apnea, with a total AHI of 48.8/hour, REM AHI of 34.3/hour, supine AHI of 59.3/hour and O2 nadir of 82%. The patient endorsed the Epworth Sleepiness Scale at 15/24. The patient's weight 210 pounds with a height of 68 (inches), resulting in a BMI of 31.7 kg/m2. The patient's neck circumference measured 16.8 inches.  CURRENT MEDICATIONS: Aspirin, Zestoretic.  PROCEDURE:  This is a multichannel digital polysomnogram utilizing the SomnoStar 11.2 system.  Electrodes and sensors were applied and monitored per AASM Specifications.   EEG, EOG, Chin and Limb EMG, were sampled at 200 Hz.  ECG, Snore and Nasal Pressure, Thermal Airflow, Respiratory Effort, CPAP Flow and Pressure, Oximetry was sampled at 50 Hz. Digital video and audio were recorded.      The patient was fitted with a F&P Simplus Large full face mask. CPAP was initiated at 5 cmH20 with heated humidity per AASM standards and pressure was advanced to 17 cmH20 because of hypopneas, apneas and desaturations.  At a PAP pressure of 15 cmH20, there was a reduction of the AHI to 3.9/hour with non-supine NREM sleep achieved and O2 nadir of 93%.  Lights Out was at 21:45 and Lights On at 05:16. Total recording time (TRT) was 452 minutes, with a total sleep time (TST) of 420 minutes. The patient's sleep latency was 4 minutes, which is reduced. REM latency was 53 minutes, which is mildly reduced. The sleep efficiency was 92.9 %.    SLEEP ARCHITECTURE: WASO (Wake after sleep onset)  was 27.5  minutes with minimal to mild sleep fragmentation noted.  There were 4.5 minutes in Stage N1, 271.5 minutes Stage N2, 43.5 minutes Stage N3 and 100.5 minutes in Stage REM.  The percentage of Stage N1 was 1.1%, Stage N2 was 64.6%, which is increased, Stage N3 was 10.4% and Stage R (REM sleep) was 23.9%, which is normal. The arousals were noted as: 38 were spontaneous, 4 were associated with PLMs, 8 were associated with respiratory events.  RESPIRATORY ANALYSIS:  There was a total of 44 respiratory events: 2 obstructive apneas, 3 central apneas and 0 mixed apneas with a total of 5 apneas and an apnea index (AHI) of .7 /hour. There were 39 hypopneas with a hypopnea index of 5.6/hour. The patient also had 0 respiratory event related arousals (RERAs).      The total APNEA/HYPOPNEA INDEX  (AHI) was 6.3 /hour and the total RESPIRATORY DISTURBANCE INDEX was 6.3 /hour  22 events occurred in REM sleep and 22 events in NREM. The REM AHI was 13.1 /hour versus a non-REM AHI of 4.1 /hour.  The patient spent 378 minutes of total sleep time in the supine position and 42 minutes in non-supine. The supine AHI was 6.7, versus a non-supine AHI of 2.9.  OXYGEN SATURATION & C02:  The baseline 02 saturation was 92%, with the lowest being 84%. Time spent below 89% saturation equaled 0 minutes.  PERIODIC LIMB MOVEMENTS:  The patient had a total of 19 Periodic Limb Movements.  The Periodic Limb Movement (PLM) index was 2.7 and the PLM Arousal index was .6 /hour.  Audio and video analysis did not show any abnormal or unusual movements, behaviors, phonations or vocalizations. The patient took no bathroom breaks. The EKG was in keeping with normal sinus rhythm.  Post-study, the patient indicated that sleep was worse than usual.    IMPRESSION:   1. Severe Obstructive Sleep Apnea (OSA) 2. Dysfunctions associated with sleep stages or arousal from sleep   RECOMMENDATIONS:   1. This study demonstrates significant improvement of  the patient's obstructive sleep apnea with CPAP therapy. I will, therefore, start the patient on home CPAP treatment at a pressure of 15 cm via large FFM with heated humidity. The patient should be reminded to be fully compliant with PAP therapy to improve sleep related symptoms and decrease long term cardiovascular risks. The patient should be reminded, that it may take up to 3 months to get fully used to using PAP with all planned sleep. The earlier full compliance is achieved, the better long term compliance tends to be. Please note that untreated obstructive sleep apnea may carry additional perioperative morbidity. Patients with significant obstructive sleep apnea should receive perioperative PAP therapy and the surgeons and particularly the anesthesiologist should be informed of the diagnosis and the severity of the sleep disordered breathing. 2. This study shows sleep fragmentation and abnormal sleep stage percentages; these are nonspecific findings and per se do not signify an intrinsic sleep disorder or a cause for the patient's sleep-related symptoms. Causes include (but are not limited to) the first night effect of the sleep study, circadian rhythm disturbances, medication effect or an underlying mood disorder or medical problem.  3. The patient should be cautioned not to drive, work at heights, or operate dangerous or heavy equipment when tired or sleepy. Review and reiteration of good sleep hygiene measures should be pursued with any patient. 4. The patient will be seen in follow-up in the sleep clinic at [facility] for discussion of the test results, symptom and treatment compliance review, further management strategies, etc. The referring provider will be notified of the test results.   I certify that I have reviewed the entire raw data recording prior to the issuance of this report in accordance with the Standards of Accreditation of the American Academy of Sleep Medicine (AASM)   Star Age, MD,  PhD Diplomat, American Board of Neurology and Sleep Medicine (Neurology and Sleep Medicine)

## 2019-07-05 NOTE — Progress Notes (Signed)
Patient referred by Dr. Carlota Raspberry for re-eval of his OSA, seen by me on 03/27/19, diagnostic PSG on 05/06/19. Patient had a CPAP titration study on 06/26/19.  Please call and inform patient that I have entered an order for treatment with positive airway pressure (PAP) treatment for obstructive sleep apnea (OSA). He did well during the latest sleep study with CPAP. We will, therefore, arrange for a machine for home use through a DME (durable medical equipment) company of His choice; and I will see the patient back in follow-up in about 10 weeks. Please also explain to the patient that I will be looking out for compliance data, which can be downloaded from the machine (stored on an SD card, that is inserted in the machine) or via remote access through a modem, that is built into the machine. At the time of the followup appointment we will discuss sleep study results and how it is going with PAP treatment at home. Please advise patient to bring His machine at the time of the first FU visit, even though this is cumbersome. Bringing the machine for every visit after that will likely not be needed, but often helps for the first visit to troubleshoot if needed. Please re-enforce the importance of compliance with treatment and the need for [place] to monitor compliance data - often an insurance requirement and actually good feedback for the patient as far as how they are doing.  Also remind patient, that any interim PAP machine or mask issues should be first addressed with the DME company, as they can often help better with technical and mask fit issues. Please ask if patient has a preference regarding DME company.  Please also make sure, the patient has a follow-up appointment with me in about 10 weeks from the setup date, thanks. May see one of our nurse practitioners if needed for proper timing of the FU appointment.  Please fax or rout report to the referring provider. Thanks,   Star Age, MD, PhD [facility])

## 2019-07-10 ENCOUNTER — Other Ambulatory Visit: Payer: Self-pay

## 2019-07-10 ENCOUNTER — Ambulatory Visit (INDEPENDENT_AMBULATORY_CARE_PROVIDER_SITE_OTHER): Payer: BC Managed Care – PPO

## 2019-07-10 ENCOUNTER — Other Ambulatory Visit: Payer: Self-pay | Admitting: Internal Medicine

## 2019-07-10 ENCOUNTER — Telehealth: Payer: Self-pay | Admitting: Internal Medicine

## 2019-07-10 DIAGNOSIS — Z1159 Encounter for screening for other viral diseases: Secondary | ICD-10-CM

## 2019-07-10 LAB — SARS CORONAVIRUS 2 (TAT 6-24 HRS): SARS Coronavirus 2: NEGATIVE

## 2019-07-10 NOTE — Telephone Encounter (Signed)
Apply Coupon=BIN: [id number] PCN: [id number] given tp pharmacy. Called patient and left a message that his Suprep should be ready for pick up

## 2019-07-13 ENCOUNTER — Encounter: Payer: BC Managed Care – PPO | Admitting: Internal Medicine

## 2019-07-16 ENCOUNTER — Telehealth: Payer: Self-pay | Admitting: Family Medicine

## 2019-07-16 NOTE — Telephone Encounter (Signed)
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 Vonna Kotyk opinion on if he is ok to return .  Please advise

## 2019-07-16 NOTE — Telephone Encounter (Signed)
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   Please Advise.

## 2019-07-17 ENCOUNTER — Encounter: Payer: Self-pay | Admitting: *Deleted

## 2019-07-17 NOTE — Telephone Encounter (Signed)
I am unaware of sleep apnea being a risk factor for increased risk of severe Covid disease. I would recommend typical precautions of masking, hand washing, and social distancing. Here is a website he can revew that discusses risk factors and prevention/recommendations:   Here is a link to the CDC, which provides information on individuals who may be at increased risk of severe illness with Covid: http://huff.org/

## 2019-07-17 NOTE — Telephone Encounter (Signed)
Sent patient a my chart message with Dr. Vonna Kotyk response

## 2019-07-25 ENCOUNTER — Encounter: Payer: Self-pay | Admitting: Family Medicine

## 2019-07-25 NOTE — Telephone Encounter (Signed)
Pt sent a message with recent BP 128/71

## 2019-07-28 ENCOUNTER — Ambulatory Visit: Payer: BC Managed Care – PPO | Attending: Internal Medicine

## 2019-07-28 DIAGNOSIS — Z23 Encounter for immunization: Secondary | ICD-10-CM | POA: Insufficient documentation

## 2019-07-28 NOTE — Progress Notes (Signed)
   Covid-19 Vaccination Clinic  Name:  Cesar [name]    MRN: [id number] DOB: 25-Dec-1955  07/28/2019  [name] was observed post Covid-19 immunization for 15 minutes without incidence. He was provided with Vaccine Information Sheet and instruction to access the V-Safe system.   Mr. Register was instructed to call 911 with any severe reactions post vaccine: Marland Kitchen Difficulty breathing  . Swelling of your face and throat  . A fast heartbeat  . A bad rash all over your body  . Dizziness and weakness    Immunizations Administered    Name Date Dose VIS Date Route   Pfizer COVID-19 Vaccine 07/28/2019  1:24 PM 0.3 mL 05/11/2019 Intramuscular   Manufacturer: [place]   Lot: [id number]   [place]: [id number]

## 2019-08-06 ENCOUNTER — Telehealth: Payer: Self-pay

## 2019-08-06 NOTE — Telephone Encounter (Signed)
FYI message from [employer]  User: Tamera Reason  Patient has decided to keep using his system one cpap that he has had until he goes Medicare in July, I did reset his pressure to 15 and got him a demo dreamwear medium full face mask to use until then. I also explained what he will need to do once he is Medicare eligible and let him know that if he needs any other information to call office and we can re-explain what he needs to do after July to get his new cpap.

## 2019-08-18 ENCOUNTER — Ambulatory Visit: Payer: BC Managed Care – PPO | Attending: Internal Medicine

## 2019-08-18 DIAGNOSIS — Z23 Encounter for immunization: Secondary | ICD-10-CM

## 2019-08-18 NOTE — Progress Notes (Signed)
   Covid-19 Vaccination Clinic  Name:  Cesar [name]    MRN: [id number] DOB: 09-17-1955  08/18/2019  [name] was observed post Covid-19 immunization for 15 minutes without incident. He was provided with Vaccine Information Sheet and instruction to access the V-Safe system.   [name] was instructed to call 911 with any severe reactions post vaccine: Marland Kitchen Difficulty breathing  . Swelling of face and throat  . A fast heartbeat  . A bad rash all over body  . Dizziness and weakness   Immunizations Administered    Name Date Dose VIS Date Route   Pfizer COVID-19 Vaccine 08/18/2019  8:15 AM 0.3 mL 05/11/2019 Intramuscular   Manufacturer: [place]   Lot: [id number]   [place]: [id number]

## 2019-11-20 ENCOUNTER — Other Ambulatory Visit: Payer: Self-pay | Admitting: Family Medicine

## 2019-11-20 DIAGNOSIS — I10 Essential (primary) hypertension: Secondary | ICD-10-CM

## 2019-11-20 NOTE — Telephone Encounter (Signed)
Requested Prescriptions  Pending Prescriptions Disp Refills  . hydrochlorothiazide (HYDRODIURIL) 12.5 MG tablet [Pharmacy Med Name: HYDROCHLOROTHIAZIDE 12.5MG  TABLETS] 45 tablet 0    Sig: TAKE 1/2 TABLET(6.25 MG) BY MOUTH DAILY     Cardiovascular: Diuretics - Thiazide Failed - 11/20/2019 12:27 PM      Failed - Last BP in normal range    BP Readings from Last 1 Encounters:  06/13/19 (!) 163/96         Passed - Ca in normal range and within 360 days    Calcium  Date Value Ref Range Status  06/13/2019 9.0 8.6 - 10.2 mg/dL Final   Calcium, Ion  Date Value Ref Range Status  04/23/2014 1.13 1.12 - 1.23 mmol/L Final         Passed - Cr in normal range and within 360 days    Creatinine, Ser  Date Value Ref Range Status  06/13/2019 0.97 0.76 - 1.27 mg/dL Final   Creatinine,U  Date Value Ref Range Status  12/20/2014 156.5 mg/dL Final         Passed - K in normal range and within 360 days    Potassium  Date Value Ref Range Status  06/13/2019 4.6 3.5 - 5.2 mmol/L Final         Passed - Na in normal range and within 360 days    Sodium  Date Value Ref Range Status  06/13/2019 138 134 - 144 mmol/L Final         Passed - Valid encounter within last 6 months    Recent Outpatient Visits          4 months ago Prediabetes   [facility], MD   5 months ago Hyperlipidemia, unspecified hyperlipidemia type   [facility], Ranell Patrick, MD   8 months ago Prediabetes   [facility], Ranell Patrick, MD   1 year ago Annual physical exam   [facility], Ranell Patrick, MD   2 years ago Hypogonadism male   [facility], Ranell Patrick, MD

## 2020-01-24 ENCOUNTER — Other Ambulatory Visit: Payer: Self-pay

## 2020-01-24 ENCOUNTER — Telehealth: Payer: Self-pay | Admitting: Family Medicine

## 2020-01-24 DIAGNOSIS — I10 Essential (primary) hypertension: Secondary | ICD-10-CM

## 2020-01-24 MED ORDER — HYDROCHLOROTHIAZIDE 12.5 MG PO TABS: ORAL_TABLET | ORAL | 0 refills | Status: DC

## 2020-01-24 NOTE — Telephone Encounter (Signed)
patient is asking for courtesy refill on  What is the name of the medication? hydrochlorothiazide (HYDRODIURIL) 12.5 MG tablet    Have you contacted your pharmacy to request a refill? n  Which pharmacy would you like this sent to?  [id number] [address] [place] [address]  [address] [place] [address]  Phone:  [phone] Fax:  [phone]   Patient notified that their request is being sent to the clinical staff for review and that they should receive a call once it is complete. If they do not receive a call within 72 hours they can check with their pharmacy or our office.   Pt has an appt 01/28/2020

## 2020-01-28 ENCOUNTER — Encounter: Payer: Self-pay | Admitting: Family Medicine

## 2020-01-28 ENCOUNTER — Ambulatory Visit (INDEPENDENT_AMBULATORY_CARE_PROVIDER_SITE_OTHER): Payer: Medicare Other | Admitting: Family Medicine

## 2020-01-28 ENCOUNTER — Other Ambulatory Visit: Payer: Self-pay

## 2020-01-28 VITALS — BP 136/77 | HR 74 | Temp 98.0°F | Ht 68.0 in | Wt 213.0 lb

## 2020-01-28 DIAGNOSIS — I10 Essential (primary) hypertension: Secondary | ICD-10-CM

## 2020-01-28 DIAGNOSIS — E785 Hyperlipidemia, unspecified: Secondary | ICD-10-CM | POA: Diagnosis not present

## 2020-01-28 DIAGNOSIS — Z125 Encounter for screening for malignant neoplasm of prostate: Secondary | ICD-10-CM

## 2020-01-28 DIAGNOSIS — Z23 Encounter for immunization: Secondary | ICD-10-CM | POA: Diagnosis not present

## 2020-01-28 DIAGNOSIS — G4733 Obstructive sleep apnea (adult) (pediatric): Secondary | ICD-10-CM

## 2020-01-28 DIAGNOSIS — Z1211 Encounter for screening for malignant neoplasm of colon: Secondary | ICD-10-CM | POA: Diagnosis not present

## 2020-01-28 DIAGNOSIS — R7303 Prediabetes: Secondary | ICD-10-CM

## 2020-01-28 MED ORDER — HYDROCHLOROTHIAZIDE 12.5 MG PO TABS: ORAL_TABLET | ORAL | 2 refills | Status: DC

## 2020-01-28 NOTE — Progress Notes (Signed)
Subjective:  Patient ID: Cesar [name], male    DOB: 12/26/1955  Age: 64 y.o. MRN: [id number]  CC:  Chief Complaint  Patient presents with  . Medication Refill    on hydrochlorothiazide. Pt reports he needs to be taking 1 whole tab po daily. PT states he has been taking the whole tab and it keeps the BP within normal range. pt checks his BP ionce a week at home and gets a reading similar to todays reading of 136/77.Pt reports no physical symptoms of hypertension.  . Referral    pt would like a new referral to [facility] for colonoscopy.    HPI CEM KOSMAN presents for   Hypertension: Has taken hydrochlorothiazide full 12.5 mg daily with home readings similar to today's readings of 130s over 70s.  Feels well at that dose. retired in June.  No lightheadedness or new side effects on this med.   BP Readings from Last 3 Encounters:  01/28/20 136/77  06/13/19 (!) 163/96  03/27/19 (!) 167/89   Lab Results  Component Value Date   CREATININE 0.92 01/28/2020   Prediabetes: Soda/sweet tea: occasional  Fast food/restaurant food:no fast food,  restaurant take out 2 per week. Cooking at home.  Exercise: started back with [employer]. 2-3 times per week past few months. Vacation last week - some dietary indiscretion last week.   Vaccinated for Covid.   Wt Readings from Last 3 Encounters:  01/28/20 213 lb (96.6 kg)  06/29/19 207 lb (93.9 kg)  06/29/19 209 lb (94.8 kg)   Lab Results  Component Value Date   HGBA1C 6.0 (H) 01/28/2020   Hyperlipidemia: Not currently on medications. Restarting exercise. The 10-year ASCVD risk score [name] DC [name]., et al., 2013) is: 18.4%   Values used to calculate the score:     Age: 21 years     Sex: Male     Is Non-Hispanic African American: Yes     Diabetic: No     Tobacco smoker: No     Systolic Blood Pressure: 628 mmHg     Is BP treated: Yes     HDL Cholesterol: 46 mg/dL     Total Cholesterol: 206 mg/dL  Lab Results  Component Value  Date   CHOL 206 (H) 01/28/2020   HDL 46 01/28/2020   LDLCALC 147 (H) 01/28/2020   LDLDIRECT 158.5 12/17/2009   TRIG 73 01/28/2020   CHOLHDL 4.5 01/28/2020   Lab Results  Component Value Date   ALT 24 01/28/2020   AST 26 01/28/2020   ALKPHOS 73 01/28/2020   BILITOT 0.3 01/28/2020    HM:  Multiple polyps with diverticulosis on colonoscopy in September 2014, Dr. Hilarie Fredrickson.  3-year follow-up plan. Scheduling appt for colonoscopy, but deductible was cost prohibitive. Agrees to referral now, may be less costly.   OSA: Diagnosed earlier this year, on CPAp, had to stop device d/t notice - plans on calling to clarify new machine.   Immunization History  Administered Date(s) Administered  . Influenza,inj,Quad PF,6+ Mos 04/24/2014, 02/07/2018  . PFIZER SARS-COV-2 Vaccination 07/28/2019, 08/18/2019  . Pneumococcal Polysaccharide-23 01/28/2020  . Tdap 11/26/2013   The natural history of prostate cancer and ongoing controversy regarding screening and potential treatment outcomes of prostate cancer has been discussed with the patient. The meaning of a false positive PSA and a false negative PSA has been discussed. He indicates understanding of the limitations of this screening test and wishes  to proceed with screening PSA testing.   History Patient  Active Problem List   Diagnosis Date Noted  . Hyperlipidemia 04/15/2016  . Right bundle branch block 04/15/2016  . Sleep apnea syndrome 12/29/2010  . DIZZINESS 07/15/2010  . Dyslipidemia 07/14/2010  . TESTOSTERONE DEFICIENCY 05/21/2008  . Essential hypertension 01/15/2008  . SHOULDER, PAIN 01/15/2008   Past Medical History:  Diagnosis Date  . Arthritis    "shoulders" (04/23/2014)  . Bursitis of shoulder   . HYPERTENSION 01/15/2008  . Hypertension   . OSA on CPAP   . SHOULDER, PAIN 01/15/2008  . Sinus headache    "take OTC medications prn"  . Sleep apnea    uses CPAP  . Tendonitis of shoulder    "both"  . TESTOSTERONE DEFICIENCY  05/21/2008  . TIA (transient ischemic attack) 04/23/2014   "that's what they think I've had" (04/23/2014)   Past Surgical History:  Procedure Laterality Date  . APPENDECTOMY  1978  . COLONOSCOPY  2014  . INGUINAL HERNIA REPAIR Left 1973  . KNEE ARTHROSCOPY WITH PATELLAR TENDON REPAIR Left 12/21/2012   Procedure: LEFT KNEE ARTHROSCOPY WITH MEDIAL and LATERALMENISECTOMY AND QUARDRICEP TENDON REPAIR;  Surgeon: Ninetta Lights, MD;  Location: [facility];  Service: Orthopedics;  Laterality: Left;  . PLANTAR FASCIA SURGERY Left 7/10   heel  . POLYPECTOMY    . TONSILLECTOMY  1960's   Allergies  Allergen Reactions  . Codeine Nausea And Vomiting   Prior to Admission medications   Medication Sig Start Date End Date Taking? Authorizing Provider  aspirin EC 81 MG EC tablet Take 1 tablet (81 mg total) by mouth daily. 04/24/14  Yes [name], [name], MD  Chlorpheniramine-Phenylephrine (SINUS & ALLERGY PO) Take 1 tablet by mouth 2 (two) times daily as needed (allergies).   Yes [provider]  hydrochlorothiazide (HYDRODIURIL) 12.5 MG tablet TAKE 1/2 TABLET(6.25 MG) BY MOUTH DAILY 01/24/20  Yes [name], MD  Na Sulfate-K Sulfate-Mg Sulf 17.5-3.13-1.6 GM/177ML SOLN Suprep (no substitutions)-TAKE AS DIRECTED. See universal coupon 06/29/19  Yes [name], [name], MD  NON FORMULARY Place 1 Device into the nose at bedtime. CPAP   Yes [provider]   Social History   Socioeconomic History  . Marital status: Married    Spouse name: Not on file  . Number of children: 5  . Years of education: Not on file  . Highest education level: Not on file  Occupational History  . Occupation: Pharmacist, hospital  Tobacco Use  . Smoking status: Never Smoker  . Smokeless tobacco: Never Used  Vaping Use  . Vaping Use: Never used  Substance and Sexual Activity  . Alcohol use: No  . Drug use: No  . Sexual activity: Yes  Other Topics Concern  . Not on file  Social History Narrative    Patient is right handed.   Patient drinks 1 cup caffeine daily   Social Determinants of Health   Financial Resource Strain:   . Difficulty of Paying Living Expenses: Not on file  Food Insecurity:   . Worried About Charity fundraiser in the Last Year: Not on file  . Ran Out of Food in the Last Year: Not on file  Transportation Needs:   . Lack of Transportation (Medical): Not on file  . Lack of Transportation (Non-Medical): Not on file  Physical Activity:   . Days of Exercise per Week: Not on file  . Minutes of Exercise per Session: Not on file  Stress:   . Feeling of Stress : Not on file  Social Connections:   . Frequency of Communication with Friends and Family: Not on file  . Frequency of Social Gatherings with Friends and Family: Not on file  . Attends Religious Services: Not on file  . Active Member of Clubs or Organizations: Not on file  . Attends Archivist Meetings: Not on file  . Marital Status: Not on file  Intimate Partner Violence:   . Fear of Current or Ex-Partner: Not on file  . Emotionally Abused: Not on file  . Physically Abused: Not on file  . Sexually Abused: Not on file    Review of Systems  Constitutional: Negative for fatigue and unexpected weight change.  Eyes: Negative for visual disturbance.  Respiratory: Negative for cough, chest tightness and shortness of breath.   Cardiovascular: Negative for chest pain, palpitations and leg swelling.  Gastrointestinal: Negative for abdominal pain and blood in stool.  Neurological: Negative for dizziness, light-headedness and headaches.     Objective:   Vitals:   01/28/20 1600  BP: 136/77  Pulse: 74  Temp: 98 F (36.7 C)  TempSrc: Temporal  SpO2: 98%  Weight: 213 lb (96.6 kg)  Height: 5\' 8"  (1.727 m)     Physical Exam Vitals reviewed.  Constitutional:      Appearance: He is well-developed.  HENT:     Head: Normocephalic and atraumatic.  Eyes:     Pupils: Pupils are equal, round, and  reactive to light.  Neck:     Vascular: No carotid bruit or JVD.  Cardiovascular:     Rate and Rhythm: Normal rate and regular rhythm.     Heart sounds: Normal heart sounds. No murmur heard.   Pulmonary:     Effort: Pulmonary effort is normal.     Breath sounds: Normal breath sounds. No rales.  Skin:    General: Skin is warm and dry.  Neurological:     Mental Status: He is alert and oriented to person, place, and time.        Assessment & Plan:  COBAIN MORICI is a 64 y.o. male . Essential hypertension - Plan: hydrochlorothiazide (HYDRODIURIL) 12.5 MG tablet, Comprehensive metabolic panel  -  Stable, tolerating current regimen at full 12.5mg  hctz. . Medications refilled. Labs pending as above.   Special screening for malignant neoplasms, colon - Plan: Ambulatory referral to Gastroenterology  Hyperlipidemia, unspecified hyperlipidemia type - Plan: Comprehensive metabolic panel, Lipid panel  -Discussed potential need again for statin, did just recently go on vacation with less adherence to diet.  Also has restarted exercise.  Check levels to determine if reasonable for continued monitoring versus statin recommendation.  Need for prophylactic vaccination against Streptococcus pneumoniae (pneumococcus) - Plan: Pneumococcal polysaccharide vaccine 23-valent greater than or equal to 2yo subcutaneous/IM  OSA (obstructive sleep apnea)  -Plans to coordinate new equipment with medical device supplier or sleep specialist.  Prediabetes - Plan: Hemoglobin A1c  -Continue to work on exercise, diet, check A1c.  Screening for malignant neoplasm of prostate - Plan: PSA  - We discussed pros and cons of prostate cancer screening, and after this discussion, he chose to have screening done. PSA obtained only.   Meds ordered this encounter  Medications  . hydrochlorothiazide (HYDRODIURIL) 12.5 MG tablet    Sig: TAKE 1 TABLET BY MOUTH DAILY    Dispense:  90 tablet    Refill:  2   Patient  Instructions    Contact [facility] or sleep specialist about CPAP questions.  Same BP med for now.  I referred you to gastroenterology for colonoscopy, and pneumonia vaccine today.  I will check some screening labs, and can decide if statin medication is recommended.  Follow-up in 6 months for physical.  Congrats on retirement!   If you have lab work done today you will be contacted with your lab results within the next 2 weeks.  If you have not heard from [place] then please contact us. The fastest way to get your results is to register for My Chart.   IF you received an x-ray today, you will receive an invoice from [facility]. Please contact [facility] at [phone] with questions or concerns regarding your invoice.   IF you received labwork today, you will receive an invoice from [facility]. Please contact [facility] at [phone] with questions or concerns regarding your invoice.   Our billing staff will not be able to assist you with questions regarding bills from these companies.  You will be contacted with the lab results as soon as they are available. The fastest way to get your results is to activate your My Chart account. Instructions are located on the last page of this paperwork. If you have not heard from [place] regarding the results in 2 weeks, please contact this office.    c     Signed, [name], MD [facility]

## 2020-01-28 NOTE — Patient Instructions (Addendum)
  Contact [facility] or sleep specialist about CPAP questions.  Same BP med for now.  I referred you to gastroenterology for colonoscopy, and pneumonia vaccine today.  I will check some screening labs, and can decide if statin medication is recommended.  Follow-up in 6 months for physical.  Congrats on retirement!   If you have lab work done today you will be contacted with your lab results within the next 2 weeks.  If you have not heard from [place] then please contact us. The fastest way to get your results is to register for My Chart.   IF you received an x-ray today, you will receive an invoice from [facility]. Please contact [facility] at [phone] with questions or concerns regarding your invoice.   IF you received labwork today, you will receive an invoice from [facility]. Please contact [facility] at [phone] with questions or concerns regarding your invoice.   Our billing staff will not be able to assist you with questions regarding bills from these companies.  You will be contacted with the lab results as soon as they are available. The fastest way to get your results is to activate your My Chart account. Instructions are located on the last page of this paperwork. If you have not heard from [place] regarding the results in 2 weeks, please contact this office.    c

## 2020-01-29 LAB — COMPREHENSIVE METABOLIC PANEL
ALT: 24 IU/L (ref 0–44)
AST: 26 IU/L (ref 0–40)
Albumin/Globulin Ratio: 1.2 (ref 1.2–2.2)
Albumin: 4.1 g/dL (ref 3.8–4.8)
Alkaline Phosphatase: 73 IU/L (ref 48–121)
BUN/Creatinine Ratio: 13 (ref 10–24)
BUN: 12 mg/dL (ref 8–27)
Bilirubin Total: 0.3 mg/dL (ref 0.0–1.2)
CO2: 26 mmol/L (ref 20–29)
Calcium: 8.9 mg/dL (ref 8.6–10.2)
Chloride: 103 mmol/L (ref 96–106)
Creatinine, Ser: 0.92 mg/dL (ref 0.76–1.27)
GFR calc Af Amer: 101 mL/min/{1.73_m2} (ref >59)
GFR calc non Af Amer: 87 mL/min/{1.73_m2} (ref >59)
Globulin, Total: 3.3 g/dL (ref 1.5–4.5)
Glucose: 86 mg/dL (ref 65–99)
Potassium: 4 mmol/L (ref 3.5–5.2)
Sodium: 139 mmol/L (ref 134–144)
Total Protein: 7.4 g/dL (ref 6.0–8.5)

## 2020-01-29 LAB — LIPID PANEL
Chol/HDL Ratio: 4.5 ratio (ref 0.0–5.0)
Cholesterol, Total: 206 mg/dL — ABNORMAL HIGH (ref 100–199)
HDL: 46 mg/dL (ref >39)
LDL Chol Calc (NIH): 147 mg/dL — ABNORMAL HIGH (ref 0–99)
Triglycerides: 73 mg/dL (ref 0–149)
VLDL Cholesterol Cal: 13 mg/dL (ref 5–40)

## 2020-01-29 LAB — HEMOGLOBIN A1C
Est. average glucose Bld gHb Est-mCnc: 126 mg/dL
Hgb A1c MFr Bld: 6 % — ABNORMAL HIGH (ref 4.8–5.6)

## 2020-01-29 LAB — PSA: Prostate Specific Ag, Serum: 0.8 ng/mL (ref 0.0–4.0)

## 2020-02-22 ENCOUNTER — Encounter: Payer: Self-pay | Admitting: Internal Medicine

## 2020-02-23 ENCOUNTER — Other Ambulatory Visit: Payer: Self-pay | Admitting: Family Medicine

## 2020-02-23 DIAGNOSIS — I10 Essential (primary) hypertension: Secondary | ICD-10-CM

## 2020-04-08 ENCOUNTER — Other Ambulatory Visit: Payer: Self-pay

## 2020-04-08 ENCOUNTER — Ambulatory Visit (AMBULATORY_SURGERY_CENTER): Payer: Self-pay | Admitting: *Deleted

## 2020-04-08 VITALS — Ht 68.0 in | Wt 206.0 lb

## 2020-04-08 DIAGNOSIS — Z86010 Personal history of colonic polyps: Secondary | ICD-10-CM

## 2020-04-08 NOTE — Progress Notes (Signed)
Patient is here in-person for PV. Patient denies any allergies to eggs or soy. Patient denies any problems with anesthesia/sedation. Patient denies any oxygen use at home. Patient denies taking any diet/weight loss medications or blood thinners. Patient is not being treated for MRSA or C-diff. Patient is aware of our care-partner policy and AEPNT-75 safety protocol. Patient thinks he has the suprep kit at home.  COVID-19 vaccines completed on 08/18/2019, per patient.

## 2020-04-15 ENCOUNTER — Telehealth: Payer: Self-pay | Admitting: Internal Medicine

## 2020-04-15 DIAGNOSIS — Z86010 Personal history of colonic polyps: Secondary | ICD-10-CM

## 2020-04-15 DIAGNOSIS — Z01818 Encounter for other preprocedural examination: Secondary | ICD-10-CM

## 2020-04-15 MED ORDER — NA SULFATE-K SULFATE-MG SULF 17.5-3.13-1.6 GM/177ML PO SOLN: ORAL | 0 refills | Status: DC

## 2020-04-15 NOTE — Telephone Encounter (Signed)
Patient is requesting for his prep medication to be called for his upcoming procedure

## 2020-04-15 NOTE — Telephone Encounter (Signed)
Suprep Rx sent into pharmacy; pt made aware

## 2020-04-22 ENCOUNTER — Encounter: Payer: Self-pay | Admitting: Internal Medicine

## 2020-04-22 ENCOUNTER — Ambulatory Visit (AMBULATORY_SURGERY_CENTER): Payer: Medicare Other | Admitting: Internal Medicine

## 2020-04-22 ENCOUNTER — Other Ambulatory Visit: Payer: Self-pay

## 2020-04-22 ENCOUNTER — Other Ambulatory Visit: Payer: Self-pay | Admitting: Internal Medicine

## 2020-04-22 VITALS — BP 117/70 | HR 75 | Temp 98.6°F | Resp 17 | Ht 68.0 in | Wt 206.0 lb

## 2020-04-22 DIAGNOSIS — D123 Benign neoplasm of transverse colon: Secondary | ICD-10-CM | POA: Diagnosis not present

## 2020-04-22 DIAGNOSIS — D126 Benign neoplasm of colon, unspecified: Secondary | ICD-10-CM | POA: Diagnosis not present

## 2020-04-22 DIAGNOSIS — Z86010 Personal history of colonic polyps: Secondary | ICD-10-CM

## 2020-04-22 MED ORDER — SODIUM CHLORIDE 0.9 % IV SOLN: 500.0000 mL | Freq: Once | INTRAVENOUS | Status: DC

## 2020-04-22 NOTE — Op Note (Signed)
[facility] Patient Name: Cesar [name] Procedure Date: 04/22/2020 12:23 PM MRN: [id number] Endoscopist: Jerene Bears , MD Age: 64 Referring MD:  Date of Birth: 1955/06/05 Gender: Male Account #: [id number] Procedure:                Colonoscopy Indications:              High risk colon cancer surveillance: Personal                            history of multiple (3 or more) adenomas, Last                            colonoscopy: September 2014 Medicines:                Monitored Anesthesia Care Procedure:                Pre-Anesthesia Assessment:                           - Prior to the procedure, a History and Physical                            was performed, and patient medications and                            allergies were reviewed. The patient's tolerance of                            previous anesthesia was also reviewed. The risks                            and benefits of the procedure and the sedation                            options and risks were discussed with the patient.                            All questions were answered, and informed consent                            was obtained. Prior Anticoagulants: The patient has                            taken no previous anticoagulant or antiplatelet                            agents. ASA Grade Assessment: II - A patient with                            mild systemic disease. After reviewing the risks                            and benefits, the patient was deemed in  satisfactory condition to undergo the procedure.                           After obtaining informed consent, the colonoscope                            was passed under direct vision. Throughout the                            procedure, the patient's blood pressure, pulse, and                            oxygen saturations were monitored continuously. The                            Colonoscope was introduced through the anus  and                            advanced to the terminal ileum. The colonoscopy was                            performed without difficulty. The patient tolerated                            the procedure well. The quality of the bowel                            preparation was good. The terminal ileum, ileocecal                            valve, appendiceal orifice, and rectum and the                            ileocecal valve, appendiceal orifice, and rectum                            were photographed. Scope In: 1:38:57 PM Scope Out: 1:55:24 PM Scope Withdrawal Time: 0 hours 15 minutes 18 seconds  Total Procedure Duration: 0 hours 16 minutes 27 seconds  Findings:                 The digital rectal exam was normal.                           The terminal ileum appeared normal.                           A polypoid lesion was found at the appendiceal                            orifice. 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 No bleeding was  present. This did not appear adenomatous. Mucosa                            including at the tip was biopsied with a cold                            forceps for histology to exclude adenoma/SSP. One                            specimen bottle was sent to pathology.                           A 3 mm polyp was found in the hepatic flexure. The                            polyp was sessile. The polyp was removed with a                            cold biopsy forceps. Resection and retrieval were                            complete.                           A 3 mm polyp was found in the proximal transverse                            colon. The polyp was sessile. The polyp was removed                            with a cold biopsy forceps. Resection and retrieval                            were complete.                           A 8 mm polyp was found in the distal transverse                             colon. The polyp was pedunculated. The polyp was                            removed with a cold snare. Resection and retrieval                            were complete.                           A 4 mm polyp was found in the distal transverse                            colon. The polyp was sessile. The polyp was removed  with a cold snare. Resection and retrieval were                            complete.                           Multiple small and large-mouthed diverticula were                            found in the sigmoid colon, descending colon and                            hepatic flexure. Erythema was seen in association                            with the diverticular opening in the sigmoid.                           The retroflexed view of the distal rectum and anal                            verge was normal and showed no anal or rectal                            abnormalities. Complications:            No immediate complications. Estimated Blood Loss:     Estimated blood loss was minimal. Impression:               - The examined portion of the ileum was normal.                           - Polypoid lesion at the appendiceal orifice.                            Biopsied.                           - One 3 mm polyp at the hepatic flexure, removed                            with a cold biopsy forceps. Resected and retrieved.                           - One 3 mm polyp in the proximal transverse colon,                            removed with a cold biopsy forceps. Resected and                            retrieved.                           - One 8 mm polyp in the distal transverse colon,  removed with a cold snare. Resected and retrieved.                           - One 4 mm polyp in the distal transverse colon,                            removed with a cold snare. Resected and retrieved.                            - Mild diverticulosis in the sigmoid colon, in the                            descending colon and at the hepatic flexure.                            Erythema was seen in association with the                            diverticular opening.                           - The distal rectum and anal verge are normal on                            retroflexion view. Recommendation:           - Patient has a contact number available for                            emergencies. The signs and symptoms of potential                            delayed complications were discussed with the                            patient. Return to normal activities tomorrow.                            Written discharge instructions were provided to the                            patient.                           - Resume previous diet.                           - Continue present medications.                           - Await pathology results.                           - Repeat colonoscopy is recommended for  surveillance. The colonoscopy date will be                            determined after pathology results from today's                            exam become available for review. Jerene Bears, MD 04/22/2020 2:08:37 PM This report has been signed electronically.

## 2020-04-22 NOTE — Patient Instructions (Signed)
Read all of the handouts given to you by your recovery room nurse.  Thank-you for choosing [place] for your healthcare needs today.  YOU HAD AN ENDOSCOPIC PROCEDURE TODAY AT [facility]:   Refer to the procedure report that was given to you for any specific questions about what was found during the examination.  If the procedure report does not answer your questions, please call your gastroenterologist to clarify.  If you requested that your care partner not be given the details of your procedure findings, then the procedure report has been included in a sealed envelope for you to review at your convenience later.  YOU SHOULD EXPECT: Some feelings of bloating in the abdomen. Passage of more gas than usual.  Walking can help get rid of the air that was put into your GI tract during the procedure and reduce the bloating. If you had a lower endoscopy (such as a colonoscopy or flexible sigmoidoscopy) you may notice spotting of blood in your stool or on the toilet paper. If you underwent a bowel prep for your procedure, you may not have a normal bowel movement for a few days.  Please Note:  You might notice some irritation and congestion in your nose or some drainage.  This is from the oxygen used during your procedure.  There is no need for concern and it should clear up in a day or so.  SYMPTOMS TO REPORT IMMEDIATELY:   Following lower endoscopy (colonoscopy or flexible sigmoidoscopy):  Excessive amounts of blood in the stool  Significant tenderness or worsening of abdominal pains  Swelling of the abdomen that is new, acute  Fever of 100F or higher   For urgent or emergent issues, a gastroenterologist can be reached at any hour by calling [phone]. Do not use MyChart messaging for urgent concerns.    DIET:  We do recommend a small meal at first, but then you may proceed to your regular diet.  Drink plenty of fluids but you should avoid alcoholic beverages for 24 hours. Try to  increase the fiber in your diet, and drink plenty of water.  ACTIVITY:  You should plan to take it easy for the rest of today and you should NOT DRIVE or use heavy machinery until tomorrow (because of the sedation medicines used during the test).    FOLLOW UP: Our staff will call the number listed on your records 48-72 hours following your procedure to check on you and address any questions or concerns that you may have regarding the information given to you following your procedure. If we do not reach you, we will leave a message.  We will attempt to reach you two times.  During this call, we will ask if you have developed any symptoms of COVID 19. If you develop any symptoms (ie: fever, flu-like symptoms, shortness of breath, cough etc.) before then, please call [phone].  If you test positive for Covid 19 in the 2 weeks post procedure, please call and report this information to [place].    If any biopsies were taken you will be contacted by phone or by letter within the next 1-3 weeks.  Please call us at [phone] if you have not heard about the biopsies in 3 weeks.    SIGNATURES/CONFIDENTIALITY: You and/or your care partner have signed paperwork which will be entered into your electronic medical record.  These signatures attest to the fact that that the information above on your After Visit Summary has been reviewed  and is understood.  Full responsibility of the confidentiality of this discharge information lies with you and/or your care-partner. 

## 2020-04-22 NOTE — Progress Notes (Signed)
Report given to PACU, vss 

## 2020-04-22 NOTE — Progress Notes (Signed)
VS-CW  Pt's states no medical or surgical changes since previsit or office visit.  

## 2020-04-22 NOTE — Progress Notes (Signed)
Called to room to assist during endoscopic procedure.  Patient ID and intended procedure confirmed with present staff. Received instructions for my participation in the procedure from the performing physician.  

## 2020-04-23 ENCOUNTER — Telehealth: Payer: Self-pay

## 2020-04-23 NOTE — Telephone Encounter (Signed)
  Follow up Call-  Call back number 04/22/2020  Post procedure Call Back phone  # [phone]  Permission to leave phone message Yes  Some recent data might be hidden     Follow up call made.  NALM

## 2020-05-08 ENCOUNTER — Encounter: Payer: Self-pay | Admitting: Internal Medicine

## 2020-07-28 ENCOUNTER — Other Ambulatory Visit: Payer: Self-pay

## 2020-07-28 ENCOUNTER — Encounter: Payer: Self-pay | Admitting: Family Medicine

## 2020-07-28 ENCOUNTER — Ambulatory Visit (INDEPENDENT_AMBULATORY_CARE_PROVIDER_SITE_OTHER): Payer: Medicare PPO | Admitting: Family Medicine

## 2020-07-28 VITALS — BP 134/84 | HR 65 | Temp 97.4°F | Ht 68.0 in | Wt 211.0 lb

## 2020-07-28 DIAGNOSIS — E785 Hyperlipidemia, unspecified: Secondary | ICD-10-CM

## 2020-07-28 DIAGNOSIS — E291 Testicular hypofunction: Secondary | ICD-10-CM

## 2020-07-28 DIAGNOSIS — G4733 Obstructive sleep apnea (adult) (pediatric): Secondary | ICD-10-CM | POA: Diagnosis not present

## 2020-07-28 DIAGNOSIS — R7303 Prediabetes: Secondary | ICD-10-CM

## 2020-07-28 DIAGNOSIS — I10 Essential (primary) hypertension: Secondary | ICD-10-CM

## 2020-07-28 MED ORDER — HYDROCHLOROTHIAZIDE 12.5 MG PO TABS: ORAL_TABLET | ORAL | 2 refills | Status: DC

## 2020-07-28 NOTE — Patient Instructions (Addendum)
Call sleep specialist (Dr. Rexene Alberts) about your machine recall and options.   We can check testosterone today, but if low, schedule follow up appointment for repeat labs and discuss treatment options.   No med changes for now.    If you have lab work done today you will be contacted with your lab results within the next 2 weeks.  If you have not heard from [place] then please contact us. The fastest way to get your results is to register for My Chart.   IF you received an x-ray today, you will receive an invoice from [facility]. Please contact [facility] at [phone] with questions or concerns regarding your invoice.   IF you received labwork today, you will receive an invoice from [facility]. Please contact [facility] at [phone] with questions or concerns regarding your invoice.   Our billing staff will not be able to assist you with questions regarding bills from these companies.  You will be contacted with the lab results as soon as they are available. The fastest way to get your results is to activate your My Chart account. Instructions are located on the last page of this paperwork. If you have not heard from [place] regarding the results in 2 weeks, please contact this office.

## 2020-07-28 NOTE — Progress Notes (Signed)
Subjective:  Patient ID: Cesar [name], male    DOB: 29-Sep-1955  Age: 65 y.o. MRN: [id number]  CC:  Chief Complaint  Patient presents with  . Follow-up    On hypertension, hyperlipidemia, prediabetes, and testosterone. Pt reports no issues with BP since last OV. Pt states he would like [place] to check his testosterone due to [place] not checking it since 2019. Pt reports no diet changes wince last OV.    HPI Cesar [name] presents for   Hypertension: With history of right bundle branch block. Hydrochlorothiazide 12.5 mg daily. On aspirin 81 mg . History of sleep apnea on CPAP prior. Working on recall from other machine? Had new machine last year, then recall.  Home readings: lower at home- 121/76.  BP Readings from Last 3 Encounters:  07/28/20 134/84  04/22/20 117/70  01/28/20 136/77   Lab Results  Component Value Date   CREATININE 0.92 01/28/2020    Hyperlipidemia: Statins have been discussed previously, plan diet and exercise approach. Still would like to avoid statin.  The 10-year ASCVD risk score [name] DC [name]., et al., 2013) is: 18%   Values used to calculate the score:     Age: 71 years     Sex: Male     Is Non-Hispanic African American: Yes     Diabetic: No     Tobacco smoker: No     Systolic Blood Pressure: 563 mmHg     Is BP treated: Yes     HDL Cholesterol: 46 mg/dL     Total Cholesterol: 206 mg/dL  Lab Results  Component Value Date   CHOL 206 (H) 01/28/2020   HDL 46 01/28/2020   LDLCALC 147 (H) 01/28/2020   LDLDIRECT 158.5 12/17/2009   TRIG 73 01/28/2020   CHOLHDL 4.5 01/28/2020   Lab Results  Component Value Date   ALT 24 01/28/2020   AST 26 01/28/2020   ALKPHOS 73 01/28/2020   BILITOT 0.3 01/28/2020    Prediabetes: Plan diet/exercise approach previously. Weight has increased since November. No recent diet or exercise changes - exercise 2-3 days per week.  Fast food - once per week.  Some sweet tea, rare pepsi.  Lab Results  Component Value Date    HGBA1C 6.0 (H) 01/28/2020   Wt Readings from Last 3 Encounters:  07/28/20 211 lb (95.7 kg)  04/22/20 206 lb (93.4 kg)  04/08/20 206 lb (93.4 kg)   History of low testosterone Last checked in 2019. No current supplementation. Question regarding cost of medications previously.  Has had insurance change.  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   History Patient Active Problem List   Diagnosis Date Noted  . Hyperlipidemia 04/15/2016  . Right bundle branch block 04/15/2016  . Sleep apnea syndrome 12/29/2010  . DIZZINESS 07/15/2010  . Dyslipidemia 07/14/2010  . TESTOSTERONE DEFICIENCY 05/21/2008  . Essential hypertension 01/15/2008  . SHOULDER, PAIN 01/15/2008   Past Medical History:  Diagnosis Date  . Arthritis    "shoulders" (04/23/2014)  . Bursitis of shoulder   . HYPERTENSION 01/15/2008  . Hypertension   . OSA on CPAP   . SHOULDER, PAIN 01/15/2008  . Sinus headache    "take OTC medications prn"  . Sleep apnea    uses CPAP  . Tendonitis of shoulder    "both"  . TESTOSTERONE DEFICIENCY 05/21/2008  . TIA (transient  ischemic attack) 04/23/2014   "that's what they think I've had" (04/23/2014)   Past Surgical History:  Procedure Laterality Date  . APPENDECTOMY  1978  . COLONOSCOPY  2014  . INGUINAL HERNIA REPAIR Left 1973  . KNEE ARTHROSCOPY WITH PATELLAR TENDON REPAIR Left 12/21/2012   Procedure: LEFT KNEE ARTHROSCOPY WITH MEDIAL and LATERALMENISECTOMY AND QUARDRICEP TENDON REPAIR;  Surgeon: Ninetta Lights, MD;  Location: [facility];  Service: Orthopedics;  Laterality: Left;  . PLANTAR FASCIA SURGERY Left 7/10   heel  . POLYPECTOMY    . TONSILLECTOMY  1960's   Allergies  Allergen Reactions  . Codeine Nausea And Vomiting   Prior to Admission medications   Medication Sig Start Date End Date Taking? Authorizing Provider  aspirin EC 81 MG  EC tablet Take 1 tablet (81 mg total) by mouth daily. 04/24/14  Yes [name], [name], MD  diphenhydrAMINE HCl (ALLERGY MED PO) Take by mouth. PRN   Yes [provider]  hydrochlorothiazide (HYDRODIURIL) 12.5 MG tablet TAKE 1 TABLET BY MOUTH DAILY 01/28/20  Yes [name], MD  Chlorpheniramine-Phenylephrine (SINUS & ALLERGY PO) Take 1 tablet by mouth 2 (two) times daily as needed (allergies).    [provider]   Social History   Socioeconomic History  . Marital status: Married    Spouse name: Not on file  . Number of children: 5  . Years of education: Not on file  . Highest education level: Not on file  Occupational History  . Occupation: Pharmacist, hospital  Tobacco Use  . Smoking status: Never Smoker  . Smokeless tobacco: Never Used  Vaping Use  . Vaping Use: Never used  Substance and Sexual Activity  . Alcohol use: No  . Drug use: No  . Sexual activity: Yes  Other Topics Concern  . Not on file  Social History Narrative   Patient is right handed.   Patient drinks 1 cup caffeine daily   Social Determinants of Health   Financial Resource Strain: Not on file  Food Insecurity: Not on file  Transportation Needs: Not on file  Physical Activity: Not on file  Stress: Not on file  Social Connections: Not on file  Intimate Partner Violence: Not on file    Review of Systems  Constitutional: Negative for fatigue and unexpected weight change.  Eyes: Negative for visual disturbance.  Respiratory: Negative for cough, chest tightness and shortness of breath.   Cardiovascular: Negative for chest pain, palpitations and leg swelling.  Gastrointestinal: Negative for abdominal pain and blood in stool.  Neurological: Negative for dizziness, light-headedness and headaches.     Objective:   Vitals:   07/28/20 0853  BP: 134/84  Pulse: 65  Temp: (!) 97.4 F (36.3 C)  TempSrc: Temporal  SpO2: 98%  Weight: 211 lb (95.7 kg)  Height: 5\' 8"  (1.727 m)     Physical  Exam Constitutional:      Appearance: He is well-developed and well-nourished.  HENT:     Head: Normocephalic and atraumatic.  Eyes:     Extraocular Movements: EOM normal.     Pupils: Pupils are equal, round, and reactive to light.  Neck:     Vascular: No carotid bruit or JVD.  Cardiovascular:     Rate and Rhythm: Normal rate and regular rhythm.     Heart sounds: Normal heart sounds. No murmur heard.   Pulmonary:     Effort: Pulmonary effort is normal.     Breath sounds: Normal breath sounds. No rales.  Musculoskeletal:        General: No edema.  Skin:    General: Skin is warm and dry.  Neurological:     Mental Status: He is alert and oriented to person, place, and time.  Psychiatric:        Mood and Affect: Mood and affect and mood normal.        Assessment & Plan:  Cesar [name] is a 65 y.o. male . Essential hypertension - Plan: hydrochlorothiazide (HYDRODIURIL) 12.5 MG tablet, Lipid panel, Comprehensive metabolic panel  -  Stable, tolerating current regimen. Medications refilled. Labs pending as above.   Hyperlipidemia, unspecified hyperlipidemia type - Plan: Lipid panel, Comprehensive metabolic panel  -Risks versus benefits of statins were discussed including ASCVD risk score along with prediabetes.  Prefers to not take statin.  Continued approach of diet/exercise planned.  Check labs.  Prediabetes - Plan: Hemoglobin A1c  -As above, plan on diet/exercise approach, check A1c  OSA (obstructive sleep apnea)  -Recommended he call sleep specialist to discuss concerns with machine and recall.  Hypogonadism in male - Plan: Testosterone  -Check testosterone, then if low repeat visit for repeat testing, monitoring labs as well as discussion of different treatment options.  Meds ordered this encounter  Medications  . hydrochlorothiazide (HYDRODIURIL) 12.5 MG tablet    Sig: TAKE 1 TABLET BY MOUTH DAILY    Dispense:  90 tablet    Refill:  2   Patient Instructions    Call sleep specialist (Dr. Rexene Alberts) about your machine recall and options.   We can check testosterone today, but if low, schedule follow up appointment for repeat labs and discuss treatment options.   No med changes for now.    If you have lab work done today you will be contacted with your lab results within the next 2 weeks.  If you have not heard from [place] then please contact us. The fastest way to get your results is to register for My Chart.   IF you received an x-ray today, you will receive an invoice from [facility]. Please contact [facility] at [phone] with questions or concerns regarding your invoice.   IF you received labwork today, you will receive an invoice from [place]. Please contact [facility] at [phone] with questions or concerns regarding your invoice.   Our billing staff will not be able to assist you with questions regarding bills from these companies.  You will be contacted with the lab results as soon as they are available. The fastest way to get your results is to activate your My Chart account. Instructions are located on the last page of this paperwork. If you have not heard from [place] regarding the results in 2 weeks, please contact this office.         Signed, [name], MD [facility]

## 2020-07-29 LAB — COMPREHENSIVE METABOLIC PANEL
ALT: 29 IU/L (ref 0–44)
AST: 31 IU/L (ref 0–40)
Albumin/Globulin Ratio: 1.2 (ref 1.2–2.2)
Albumin: 4.1 g/dL (ref 3.8–4.8)
Alkaline Phosphatase: 87 IU/L (ref 44–121)
BUN/Creatinine Ratio: 11 (ref 10–24)
BUN: 11 mg/dL (ref 8–27)
Bilirubin Total: 0.3 mg/dL (ref 0.0–1.2)
CO2: 26 mmol/L (ref 20–29)
Calcium: 9.1 mg/dL (ref 8.6–10.2)
Chloride: 98 mmol/L (ref 96–106)
Creatinine, Ser: 0.99 mg/dL (ref 0.76–1.27)
Globulin, Total: 3.3 g/dL (ref 1.5–4.5)
Glucose: 120 mg/dL — ABNORMAL HIGH (ref 65–99)
Potassium: 4.3 mmol/L (ref 3.5–5.2)
Sodium: 138 mmol/L (ref 134–144)
Total Protein: 7.4 g/dL (ref 6.0–8.5)
eGFR: 85 mL/min/{1.73_m2} (ref >59)

## 2020-07-29 LAB — TESTOSTERONE: Testosterone: 173 ng/dL — ABNORMAL LOW (ref 264–916)

## 2020-07-29 LAB — LIPID PANEL
Chol/HDL Ratio: 5 ratio (ref 0.0–5.0)
Cholesterol, Total: 205 mg/dL — ABNORMAL HIGH (ref 100–199)
HDL: 41 mg/dL (ref >39)
LDL Chol Calc (NIH): 148 mg/dL — ABNORMAL HIGH (ref 0–99)
Triglycerides: 87 mg/dL (ref 0–149)
VLDL Cholesterol Cal: 16 mg/dL (ref 5–40)

## 2020-07-29 LAB — HEMOGLOBIN A1C
Est. average glucose Bld gHb Est-mCnc: 128 mg/dL
Hgb A1c MFr Bld: 6.1 % — ABNORMAL HIGH (ref 4.8–5.6)

## 2020-08-05 ENCOUNTER — Other Ambulatory Visit: Payer: Self-pay | Admitting: Family Medicine

## 2020-08-05 DIAGNOSIS — E291 Testicular hypofunction: Secondary | ICD-10-CM

## 2020-09-01 DIAGNOSIS — H43812 Vitreous degeneration, left eye: Secondary | ICD-10-CM | POA: Diagnosis not present

## 2020-09-01 DIAGNOSIS — H35033 Hypertensive retinopathy, bilateral: Secondary | ICD-10-CM | POA: Diagnosis not present

## 2020-09-01 DIAGNOSIS — H1045 Other chronic allergic conjunctivitis: Secondary | ICD-10-CM | POA: Diagnosis not present

## 2020-09-01 DIAGNOSIS — H25813 Combined forms of age-related cataract, bilateral: Secondary | ICD-10-CM | POA: Diagnosis not present

## 2020-09-15 DIAGNOSIS — H25813 Combined forms of age-related cataract, bilateral: Secondary | ICD-10-CM | POA: Diagnosis not present

## 2020-09-15 DIAGNOSIS — H35032 Hypertensive retinopathy, left eye: Secondary | ICD-10-CM | POA: Diagnosis not present

## 2020-09-15 DIAGNOSIS — H1045 Other chronic allergic conjunctivitis: Secondary | ICD-10-CM | POA: Diagnosis not present

## 2020-09-15 DIAGNOSIS — H43812 Vitreous degeneration, left eye: Secondary | ICD-10-CM | POA: Diagnosis not present

## 2020-09-19 DIAGNOSIS — E669 Obesity, unspecified: Secondary | ICD-10-CM | POA: Diagnosis not present

## 2020-09-19 DIAGNOSIS — Z823 Family history of stroke: Secondary | ICD-10-CM | POA: Diagnosis not present

## 2020-09-19 DIAGNOSIS — I10 Essential (primary) hypertension: Secondary | ICD-10-CM | POA: Diagnosis not present

## 2020-09-19 DIAGNOSIS — Z8249 Family history of ischemic heart disease and other diseases of the circulatory system: Secondary | ICD-10-CM | POA: Diagnosis not present

## 2020-09-19 DIAGNOSIS — Z6830 Body mass index (BMI) 30.0-30.9, adult: Secondary | ICD-10-CM | POA: Diagnosis not present

## 2020-12-14 ENCOUNTER — Encounter: Payer: Self-pay | Admitting: Family Medicine

## 2021-01-26 ENCOUNTER — Other Ambulatory Visit: Payer: Self-pay

## 2021-01-26 ENCOUNTER — Encounter: Payer: Self-pay | Admitting: Family Medicine

## 2021-01-26 ENCOUNTER — Ambulatory Visit: Payer: Medicare PPO | Admitting: Family Medicine

## 2021-01-26 VITALS — BP 136/78 | HR 71 | Temp 98.1°F | Resp 17 | Ht 68.0 in | Wt 206.6 lb

## 2021-01-26 DIAGNOSIS — E291 Testicular hypofunction: Secondary | ICD-10-CM

## 2021-01-26 DIAGNOSIS — I10 Essential (primary) hypertension: Secondary | ICD-10-CM

## 2021-01-26 DIAGNOSIS — R7303 Prediabetes: Secondary | ICD-10-CM | POA: Diagnosis not present

## 2021-01-26 DIAGNOSIS — E785 Hyperlipidemia, unspecified: Secondary | ICD-10-CM

## 2021-01-26 DIAGNOSIS — G4733 Obstructive sleep apnea (adult) (pediatric): Secondary | ICD-10-CM

## 2021-01-26 LAB — COMPREHENSIVE METABOLIC PANEL
ALT: 20 U/L (ref 0–53)
AST: 23 U/L (ref 0–37)
Albumin: 4.1 g/dL (ref 3.5–5.2)
Alkaline Phosphatase: 58 U/L (ref 39–117)
BUN: 18 mg/dL (ref 6–23)
CO2: 29 mEq/L (ref 19–32)
Calcium: 9.3 mg/dL (ref 8.4–10.5)
Chloride: 100 mEq/L (ref 96–112)
Creatinine, Ser: 0.96 mg/dL (ref 0.40–1.50)
GFR: 82.57 mL/min (ref >60.00)
Glucose, Bld: 86 mg/dL (ref 70–99)
Potassium: 3.7 mEq/L (ref 3.5–5.1)
Sodium: 138 mEq/L (ref 135–145)
Total Bilirubin: 0.7 mg/dL (ref 0.2–1.2)
Total Protein: 7.8 g/dL (ref 6.0–8.3)

## 2021-01-26 LAB — LIPID PANEL
Cholesterol: 216 mg/dL — ABNORMAL HIGH (ref 0–200)
HDL: 41.3 mg/dL (ref >39.00)
LDL Cholesterol: 162 mg/dL — ABNORMAL HIGH (ref 0–99)
NonHDL: 175
Total CHOL/HDL Ratio: 5
Triglycerides: 66 mg/dL (ref 0.0–149.0)
VLDL: 13.2 mg/dL (ref 0.0–40.0)

## 2021-01-26 LAB — TESTOSTERONE: Testosterone: 161.26 ng/dL — ABNORMAL LOW (ref 300.00–890.00)

## 2021-01-26 LAB — HEMOGLOBIN A1C: Hgb A1c MFr Bld: 6 % (ref 4.6–6.5)

## 2021-01-26 MED ORDER — HYDROCHLOROTHIAZIDE 12.5 MG PO TABS: ORAL_TABLET | ORAL | 2 refills | Status: DC

## 2021-01-26 NOTE — Progress Notes (Signed)
Subjective:  Patient ID: Cesar [name], male    DOB: Oct 24, 1955  Age: 65 y.o. MRN: [id number]  CC:  Chief Complaint  Patient presents with   Hyperlipidemia    Pt here for reck on labs today as well as refill    Prediabetes    Pt due for recheck A1c level     HPI Cesar [name] presents for   Hypertension: With history of right bundle branch block, sleep apnea on CPAP (off temporarily d/t recall of prior device).  Hydrochlorothiazide 12.5 mg daily. Home readings: 120/70 range. No new side effects.  covid infection in July. Minimal symptoms.  BP Readings from Last 3 Encounters:  01/26/21 136/78  07/28/20 134/84  04/22/20 117/70   Lab Results  Component Value Date   CREATININE 0.99 07/28/2020   Hyperlipidemia: No current statin, have recommended statin and most recently in February ASCVD risk score of 18%. No FH of heart disease.  Declines statin - would like to see numbers with weight loss.  Lab Results  Component Value Date   CHOL 205 (H) 07/28/2020   HDL 41 07/28/2020   LDLCALC 148 (H) 07/28/2020   LDLDIRECT 158.5 12/17/2009   TRIG 87 07/28/2020   CHOLHDL 5.0 07/28/2020   Lab Results  Component Value Date   ALT 29 07/28/2020   AST 31 07/28/2020   ALKPHOS 87 07/28/2020   BILITOT 0.3 07/28/2020    Hypogonadism/low testosterone Reading of 173 in February.  Previously had been low few years ago as well.  Plan for repeat testing between 8 and 10 AM, lab only order was placed but has not been collected.  Question of cost of medications previously.will repeat test today with option to meet with urology for treatment options.   Prediabetes: Increase diet, lifestyle modification discussed that February visit, weight had increased at that time. Increased exercise - walking more, 10k steps per day.  Part time teaching at [employer].  Weight down 5#.  Sweet tea/sodas: few on occasion.  Fast food - rare.  Lab Results  Component Value Date   HGBA1C 6.1 (H)  07/28/2020   Wt Readings from Last 3 Encounters:  01/26/21 206 lb 9.6 oz (93.7 kg)  07/28/20 211 lb (95.7 kg)  04/22/20 206 lb (93.4 kg)     History Patient Active Problem List   Diagnosis Date Noted   Hyperlipidemia 04/15/2016   Right bundle branch block 04/15/2016   Sleep apnea syndrome 12/29/2010   DIZZINESS 07/15/2010   Dyslipidemia 07/14/2010   TESTOSTERONE DEFICIENCY 05/21/2008   Essential hypertension 01/15/2008   SHOULDER, PAIN 01/15/2008   Past Medical History:  Diagnosis Date   Arthritis    "shoulders" (04/23/2014)   Bursitis of shoulder    HYPERTENSION 01/15/2008   Hypertension    OSA on CPAP    SHOULDER, PAIN 01/15/2008   Sinus headache    "take OTC medications prn"   Sleep apnea    uses CPAP   Tendonitis of shoulder    "both"   TESTOSTERONE DEFICIENCY 05/21/2008   TIA (transient ischemic attack) 04/23/2014   "that's what they think I've had" (04/23/2014)   Past Surgical History:  Procedure Laterality Date   APPENDECTOMY  1978   COLONOSCOPY  2014   INGUINAL HERNIA REPAIR Left 1973   KNEE ARTHROSCOPY WITH PATELLAR TENDON REPAIR Left 12/21/2012   Procedure: LEFT KNEE ARTHROSCOPY WITH MEDIAL and [place];  Surgeon: Ninetta Lights, MD;  Location: [place];  Service: Orthopedics;  Laterality: Left;   PLANTAR FASCIA SURGERY Left 7/10   heel   POLYPECTOMY     TONSILLECTOMY  1960's   Allergies  Allergen Reactions   Codeine Nausea And Vomiting   Prior to Admission medications   Medication Sig Start Date End Date Taking? Authorizing Provider  aspirin EC 81 MG EC tablet Take 1 tablet (81 mg total) by mouth daily. 04/24/14   [name], [name], MD  diphenhydrAMINE HCl (ALLERGY MED PO) Take by mouth. PRN    [provider]  hydrochlorothiazide (HYDRODIURIL) 12.5 MG tablet TAKE 1 TABLET BY MOUTH DAILY 07/28/20   [name], MD   Social History   Socioeconomic History   Marital status:  Married    Spouse name: Not on file   Number of children: 5   Years of education: Not on file   Highest education level: Not on file  Occupational History   Occupation: teacher  Tobacco Use   Smoking status: Never   Smokeless tobacco: Never  Vaping Use   Vaping Use: Never used  Substance and Sexual Activity   Alcohol use: No   Drug use: No   Sexual activity: Yes  Other Topics Concern   Not on file  Social History Narrative   Patient is right handed.   Patient drinks 1 cup caffeine daily   Social Determinants of Health   Financial Resource Strain: Not on file  Food Insecurity: Not on file  Transportation Needs: Not on file  Physical Activity: Not on file  Stress: Not on file  Social Connections: Not on file  Intimate Partner Violence: Not on file    Review of Systems  Constitutional:  Negative for fatigue and unexpected weight change.  Eyes:  Negative for visual disturbance.  Respiratory:  Negative for cough, chest tightness and shortness of breath.   Cardiovascular:  Negative for chest pain, palpitations and leg swelling.  Gastrointestinal:  Negative for abdominal pain and blood in stool.  Neurological:  Negative for dizziness, light-headedness and headaches.    Objective:   Vitals:   01/26/21 0848  BP: 136/78  Pulse: 71  Resp: 17  Temp: 98.1 F (36.7 C)  TempSrc: Temporal  SpO2: 95%  Weight: 206 lb 9.6 oz (93.7 kg)  Height: '5\' 8"'$  (1.727 m)     Physical Exam Vitals reviewed.  Constitutional:      Appearance: He is well-developed.  HENT:     Head: Normocephalic and atraumatic.  Neck:     Vascular: No carotid bruit or JVD.  Cardiovascular:     Rate and Rhythm: Normal rate and regular rhythm.     Heart sounds: Normal heart sounds. No murmur heard. Pulmonary:     Effort: Pulmonary effort is normal.     Breath sounds: Normal breath sounds. No rales.  Musculoskeletal:     Right lower leg: No edema.     Left lower leg: No edema.  Skin:    General:  Skin is warm and dry.  Neurological:     Mental Status: He is alert and oriented to person, place, and time.  Psychiatric:        Mood and Affect: Mood normal.        Behavior: Behavior normal.       Assessment & Plan:  Cesar [name] is a 65 y.o. male . Hyperlipidemia, unspecified hyperlipidemia type - Plan: Lipid panel  - commended on weigh loss. Repeat labs. Discussed ascvd risk and statin recommendations.  Prediabetes - Plan: Hemoglobin A1c  - anticipate improvement with weight loss - commended. Check A1c.   Essential hypertension - Plan: Comprehensive metabolic panel, hydrochlorothiazide (HYDRODIURIL) 12.5 MG tablet  -  Stable, tolerating current regimen. Medications refilled. Labs pending as above.  OSA - not currently treated as above. Follow up with sleep specialist to coordinate new equipment if needed.   Hypogonadism in male - Plan: Testosterone  - repeat levels and urology follow up for med options if still low.   Meds ordered this encounter  Medications   hydrochlorothiazide (HYDRODIURIL) 12.5 MG tablet    Sig: TAKE 1 TABLET BY MOUTH DAILY    Dispense:  90 tablet    Refill:  2    Patient Instructions  Contact sleep specialist if difficulty obtaining CPAP machine.  No change in blood pressure med for now.  Keep up the good work with exercise.  If testosterone is low, I can refer you to urology to discuss med options.   Thanks for coming in today and take care.     Signed,   [name], MD [facility] 01/26/21 9:12 AM

## 2021-01-26 NOTE — Patient Instructions (Addendum)
Contact sleep specialist if difficulty obtaining CPAP machine.  No change in blood pressure med for now.  Keep up the good work with exercise.  If testosterone is low, I can refer you to urology to discuss med options.   Thanks for coming in today and take care.

## 2021-02-03 ENCOUNTER — Other Ambulatory Visit: Payer: Self-pay | Admitting: Family Medicine

## 2021-02-03 DIAGNOSIS — E291 Testicular hypofunction: Secondary | ICD-10-CM

## 2021-02-03 NOTE — Progress Notes (Signed)
See labs 

## 2021-08-03 ENCOUNTER — Ambulatory Visit: Payer: Medicare PPO | Admitting: Family Medicine

## 2021-08-03 VITALS — BP 134/72 | HR 69 | Temp 98.0°F | Resp 16 | Ht 68.0 in | Wt 210.8 lb

## 2021-08-03 DIAGNOSIS — I10 Essential (primary) hypertension: Secondary | ICD-10-CM

## 2021-08-03 DIAGNOSIS — R7303 Prediabetes: Secondary | ICD-10-CM

## 2021-08-03 DIAGNOSIS — Z23 Encounter for immunization: Secondary | ICD-10-CM | POA: Diagnosis not present

## 2021-08-03 DIAGNOSIS — E785 Hyperlipidemia, unspecified: Secondary | ICD-10-CM

## 2021-08-03 LAB — LIPID PANEL
Cholesterol: 186 mg/dL (ref 0–200)
HDL: 39.5 mg/dL (ref >39.00)
LDL Cholesterol: 137 mg/dL — ABNORMAL HIGH (ref 0–99)
NonHDL: 146.98
Total CHOL/HDL Ratio: 5
Triglycerides: 52 mg/dL (ref 0.0–149.0)
VLDL: 10.4 mg/dL (ref 0.0–40.0)

## 2021-08-03 LAB — COMPREHENSIVE METABOLIC PANEL
ALT: 20 U/L (ref 0–53)
AST: 23 U/L (ref 0–37)
Albumin: 4.1 g/dL (ref 3.5–5.2)
Alkaline Phosphatase: 54 U/L (ref 39–117)
BUN: 10 mg/dL (ref 6–23)
CO2: 32 mEq/L (ref 19–32)
Calcium: 9.3 mg/dL (ref 8.4–10.5)
Chloride: 97 mEq/L (ref 96–112)
Creatinine, Ser: 1.08 mg/dL (ref 0.40–1.50)
GFR: 71.43 mL/min (ref >60.00)
Glucose, Bld: 91 mg/dL (ref 70–99)
Potassium: 3.6 mEq/L (ref 3.5–5.1)
Sodium: 138 mEq/L (ref 135–145)
Total Bilirubin: 0.7 mg/dL (ref 0.2–1.2)
Total Protein: 7.4 g/dL (ref 6.0–8.3)

## 2021-08-03 LAB — HEMOGLOBIN A1C: Hgb A1c MFr Bld: 5.8 % (ref 4.6–6.5)

## 2021-08-03 MED ORDER — HYDROCHLOROTHIAZIDE 12.5 MG PO TABS: ORAL_TABLET | ORAL | 2 refills | Status: DC

## 2021-08-03 NOTE — Progress Notes (Signed)
Subjective:  Patient ID: Cesar [name], male    DOB: [birth year]  Age: 66 y.o. MRN: [id number]  CC:  Chief Complaint  Patient presents with   Hypertension    Pt here for recheck, no concerns, pt denies physical sxs, occasional headache, notes allergies    Prediabetes    Recheck    Hyperlipidemia    Recheck    Immunizations    Pt reports has had shingles vaccine will request from [facility], notes no pneumonia since 2021     HPI Cesar [name] presents for   Prediabetes: Increased walking, slight improved weight by 5 pounds at his August visit. Off exercise for awhile, now back to exercising past few weeks. Some sodas at times recently, some take out.   Lab Results  Component Value Date   HGBA1C 6.0 01/26/2021   Wt Readings from Last 3 Encounters:  08/03/21 210 lb 12.8 oz (95.6 kg)  01/26/21 206 lb 9.6 oz (93.7 kg)  07/28/20 211 lb (95.7 kg)   Hypertension: With history of RBBB, sleep apnea - CPAP machine issues d/t recall. HCTZ 12.5 mg daily. Home readings: [employer] screening few weeks ago. Mild elevation but had shoulder pain - 130/90. 130/70 on his readings. No new med side effects.  Now on testosterone with urology.  BP Readings from Last 3 Encounters:  08/03/21 134/72  01/26/21 136/78  07/28/20 134/84   Lab Results  Component Value Date   CREATININE 0.96 01/26/2021   Hyperlipidemia: Statins recommended previously, declined.  Previously discussed elevated ASCVD risk score.Plan for diet/weight loss.  Weight has increased some since last visit.   Still declines cholesterol med and declines coronary calcium testing at this time.  The 10-year ASCVD risk score (Arnett DK, et al., 2019) is: 19.4%   Values used to calculate the score:     Age: 22 years     Sex: Male     Is Non-Hispanic African American: Yes     Diabetic: No     Tobacco smoker: No     Systolic Blood Pressure: 716 mmHg     Is BP treated: Yes     HDL Cholesterol: 41.3 mg/dL     Total  Cholesterol: 216 mg/dLd  Lab Results  Component Value Date   CHOL 216 (H) 01/26/2021   HDL 41.30 01/26/2021   LDLCALC 162 (H) 01/26/2021   LDLDIRECT 158.5 12/17/2009   TRIG 66.0 01/26/2021   CHOLHDL 5 01/26/2021   Lab Results  Component Value Date   ALT 20 01/26/2021   AST 23 01/26/2021   ALKPHOS 58 01/26/2021   BILITOT 0.7 01/26/2021    Health maintenance Flu vaccine: declines COVID-vaccine booster: declines Shingles vaccine: had at pharmacy Pneumonia vaccine: Pneumovax in August 2021.  Prevnar today.  Immunization History  Administered Date(s) Administered   Influenza,inj,Quad PF,6+ Mos 04/24/2014, 02/07/2018   PFIZER(Purple Top)SARS-COV-2 Vaccination 07/28/2019, 08/18/2019   Pneumococcal Polysaccharide-23 01/28/2020   Tdap 11/26/2013      History Patient Active Problem List   Diagnosis Date Noted   Hyperlipidemia 04/15/2016   Right bundle branch block 04/15/2016   Sleep apnea syndrome 12/29/2010   DIZZINESS 07/15/2010   Dyslipidemia 07/14/2010   TESTOSTERONE DEFICIENCY 05/21/2008   Essential hypertension 01/15/2008   SHOULDER, PAIN 01/15/2008   Past Medical History:  Diagnosis Date   Arthritis    "shoulders" (04/23/2014)   Bursitis of shoulder    HYPERTENSION 01/15/2008   Hypertension    OSA on CPAP    SHOULDER, PAIN 01/15/2008  Sinus headache    "take OTC medications prn"   Sleep apnea    uses CPAP   Tendonitis of shoulder    "both"   TESTOSTERONE DEFICIENCY 05/21/2008   TIA (transient ischemic attack) 04/23/2014   "that's what they think I've had" (04/23/2014)   Past Surgical History:  Procedure Laterality Date   APPENDECTOMY  1978   COLONOSCOPY  2014   INGUINAL HERNIA REPAIR Left 1973   KNEE ARTHROSCOPY WITH PATELLAR TENDON REPAIR Left 12/21/2012   Procedure: LEFT KNEE ARTHROSCOPY WITH MEDIAL and [place];  Surgeon: Ninetta Lights, MD;  Location: [facility];  Service: Orthopedics;   Laterality: Left;   PLANTAR FASCIA SURGERY Left 7/10   heel   POLYPECTOMY     TONSILLECTOMY  1960's   Allergies  Allergen Reactions   Codeine Nausea And Vomiting   Prior to Admission medications   Medication Sig Start Date End Date Taking? Authorizing Provider  aspirin EC 81 MG EC tablet Take 1 tablet (81 mg total) by mouth daily. 04/24/14  Yes [name], [name], MD  diphenhydrAMINE HCl (ALLERGY MED PO) Take by mouth. PRN   Yes [provider]  hydrochlorothiazide (HYDRODIURIL) 12.5 MG tablet TAKE 1 TABLET BY MOUTH DAILY 01/26/21  Yes [name], MD  testosterone cypionate (DEPOTESTOSTERONE CYPIONATE) 200 MG/ML injection Inject 0.5 mLs into the muscle once a week. 07/01/21  Yes [provider]   Social History   Socioeconomic History   Marital status: Married    Spouse name: Not on file   Number of children: 5   Years of education: Not on file   Highest education level: Not on file  Occupational History   Occupation: teacher  Tobacco Use   Smoking status: Never   Smokeless tobacco: Never  Vaping Use   Vaping Use: Never used  Substance and Sexual Activity   Alcohol use: No   Drug use: No   Sexual activity: Yes  Other Topics Concern   Not on file  Social History Narrative   Patient is right handed.   Patient drinks 1 cup caffeine daily   Social Determinants of Health   Financial Resource Strain: Not on file  Food Insecurity: Not on file  Transportation Needs: Not on file  Physical Activity: Not on file  Stress: Not on file  Social Connections: Not on file  Intimate Partner Violence: Not on file    Review of Systems  Constitutional:  Negative for fatigue and unexpected weight change.  Eyes:  Negative for visual disturbance.  Respiratory:  Negative for cough, chest tightness and shortness of breath.   Cardiovascular:  Negative for chest pain, palpitations and leg swelling.  Gastrointestinal:  Negative for abdominal pain and blood in stool.   Neurological:  Negative for dizziness, light-headedness and headaches.    Objective:   Vitals:   08/03/21 0840  BP: 134/72  Pulse: 69  Resp: 16  Temp: 98 F (36.7 C)  TempSrc: Temporal  SpO2: 96%  Weight: 210 lb 12.8 oz (95.6 kg)  Height: '5\' 8"'$  (1.727 m)     Physical Exam Vitals reviewed.  Constitutional:      Appearance: He is well-developed.  HENT:     Head: Normocephalic and atraumatic.  Neck:     Vascular: No carotid bruit or JVD.  Cardiovascular:     Rate and Rhythm: Normal rate and regular rhythm.     Heart sounds: Normal heart sounds. No murmur heard. Pulmonary:  Effort: Pulmonary effort is normal.     Breath sounds: Normal breath sounds. No rales.  Musculoskeletal:     Right lower leg: No edema.     Left lower leg: No edema.  Skin:    General: Skin is warm and dry.  Neurological:     Mental Status: He is alert and oriented to person, place, and time.  Psychiatric:        Mood and Affect: Mood normal.       Assessment & Plan:  Cesar [name] is a 66 y.o. male . Prediabetes - Plan: Comprehensive metabolic panel, Hemoglobin A1c  -Check A1c, cut back on sodas, take out food/restaurant food cautions.  Commended on restart of exercise.  Essential hypertension - Plan: hydrochlorothiazide (HYDRODIURIL) 12.5 MG tablet, Comprehensive metabolic panel Controlled, but anticipate improved control once he starts back on CPAP.  Advised him to call CPAP company to see if there are other options or if he needs a referral to sleep specialist to evaluate for other devices/options.  Continue HCTZ same dose.  Hyperlipidemia, unspecified hyperlipidemia type - Plan: Comprehensive metabolic panel, Lipid panel  -Elevated ASCVD risk score discussed, option of coronary calcium scoring or statin recommended, declines both at this time.  Need for pneumococcal vaccination - Plan: Pneumococcal conjugate vaccine 13-valent IM given.   Meds ordered this encounter  Medications    hydrochlorothiazide (HYDRODIURIL) 12.5 MG tablet    Sig: TAKE 1 TABLET BY MOUTH DAILY    Dispense:  90 tablet    Refill:  2   Patient Instructions  Goal of 155mn per week exercise, spread out over most days. Try to avoid sodas and other sugar containing beverages. Be careful with takeout or restaurant food as well as that may worsen weight, blood sugar, and cholesterol.     Signed,   JMerri Ray MD [facility] 08/03/21 9:03 AM

## 2021-08-03 NOTE — Patient Instructions (Addendum)
Goal of 152mn per week exercise, spread out over most days. Try to avoid sodas and other sugar containing beverages. Be careful with takeout or restaurant food as well as that may worsen weight, blood sugar, and cholesterol.  ?No med changes today.  Let me know if you would like to have the coronary calcium scoring test to evaluate for need for cholesterol medication but we will check those levels today.  Keep up the good work with exercise. ?

## 2021-08-05 ENCOUNTER — Other Ambulatory Visit: Payer: Self-pay | Admitting: Family Medicine

## 2021-08-12 ENCOUNTER — Other Ambulatory Visit: Payer: Self-pay | Admitting: Family Medicine

## 2021-08-12 DIAGNOSIS — Z87891 Personal history of nicotine dependence: Secondary | ICD-10-CM

## 2021-08-12 DIAGNOSIS — Z136 Encounter for screening for cardiovascular disorders: Secondary | ICD-10-CM

## 2021-08-28 ENCOUNTER — Ambulatory Visit: Payer: Medicare PPO

## 2021-09-02 ENCOUNTER — Ambulatory Visit
Admission: RE | Admit: 2021-09-02 | Discharge: 2021-09-02 | Disposition: A | Discharge status: Home / Self Care | Payer: Medicare PPO | Source: Ambulatory Visit | Attending: Family Medicine | Admitting: Family Medicine

## 2021-09-02 DIAGNOSIS — Z87891 Personal history of nicotine dependence: Secondary | ICD-10-CM

## 2021-09-02 DIAGNOSIS — Z136 Encounter for screening for cardiovascular disorders: Secondary | ICD-10-CM

## 2022-02-08 ENCOUNTER — Ambulatory Visit: Payer: Medicare PPO | Admitting: Family Medicine

## 2022-04-19 ENCOUNTER — Other Ambulatory Visit: Payer: Self-pay | Admitting: Family Medicine

## 2022-04-19 DIAGNOSIS — I10 Essential (primary) hypertension: Secondary | ICD-10-CM

## 2022-06-07 ENCOUNTER — Other Ambulatory Visit: Payer: Self-pay | Admitting: Family Medicine

## 2022-06-07 ENCOUNTER — Encounter: Payer: Self-pay | Admitting: Family Medicine

## 2022-06-07 DIAGNOSIS — R109 Unspecified abdominal pain: Secondary | ICD-10-CM

## 2022-06-29 ENCOUNTER — Ambulatory Visit
Admission: RE | Admit: 2022-06-29 | Discharge: 2022-06-29 | Disposition: A | Discharge status: Home / Self Care | Payer: Medicare HMO | Source: Ambulatory Visit | Attending: Family Medicine | Admitting: Family Medicine

## 2022-06-29 DIAGNOSIS — R109 Unspecified abdominal pain: Secondary | ICD-10-CM

## 2022-06-29 MED ORDER — IOPAMIDOL (ISOVUE-300) INJECTION 61%
100.0000 mL | Freq: Once | INTRAVENOUS | Status: AC | PRN
  Administered 2022-06-29: 100 mL via INTRAVENOUS

## 2023-04-21 ENCOUNTER — Encounter: Payer: Self-pay | Admitting: Internal Medicine

## 2023-06-07 ENCOUNTER — Encounter: Payer: Self-pay | Admitting: Internal Medicine

## 2023-07-05 ENCOUNTER — Telehealth: Payer: Self-pay | Admitting: *Deleted

## 2023-07-05 NOTE — Telephone Encounter (Signed)
 Attempt to reach pt for pre-visit. LM with call back #.  Will attempt to reach again in 5 min due to no other # listed in profile  Second attempt to reach pt for pre-vist unsuccessful. LM with facility # for pt to call back. Instructed pt to call # given by end of the day and reschedule the pre-visit  with RN or the scheduled procedure will be canceled.

## 2023-07-07 ENCOUNTER — Ambulatory Visit (AMBULATORY_SURGERY_CENTER): Payer: Medicare HMO

## 2023-07-07 VITALS — Ht 68.0 in | Wt 190.0 lb

## 2023-07-07 DIAGNOSIS — Z860100 Personal history of colon polyps, unspecified: Secondary | ICD-10-CM

## 2023-07-07 MED ORDER — NA SULFATE-K SULFATE-MG SULF 17.5-3.13-1.6 GM/177ML PO SOLN: 1.0000 | Freq: Once | ORAL | 0 refills | Status: AC

## 2023-07-07 NOTE — Progress Notes (Signed)

## 2023-07-18 ENCOUNTER — Encounter: Payer: Self-pay | Admitting: Internal Medicine

## 2023-07-19 ENCOUNTER — Ambulatory Visit (AMBULATORY_SURGERY_CENTER): Payer: Medicare HMO | Admitting: Internal Medicine

## 2023-07-19 ENCOUNTER — Encounter: Payer: Self-pay | Admitting: Internal Medicine

## 2023-07-19 VITALS — BP 129/74 | HR 64 | Temp 98.5°F | Resp 14 | Ht 68.0 in | Wt 190.0 lb

## 2023-07-19 DIAGNOSIS — Z1211 Encounter for screening for malignant neoplasm of colon: Secondary | ICD-10-CM | POA: Diagnosis not present

## 2023-07-19 DIAGNOSIS — K5730 Diverticulosis of large intestine without perforation or abscess without bleeding: Secondary | ICD-10-CM

## 2023-07-19 DIAGNOSIS — D121 Benign neoplasm of appendix: Secondary | ICD-10-CM

## 2023-07-19 DIAGNOSIS — Z860100 Personal history of colon polyps, unspecified: Secondary | ICD-10-CM

## 2023-07-19 DIAGNOSIS — D123 Benign neoplasm of transverse colon: Secondary | ICD-10-CM

## 2023-07-19 MED ORDER — SODIUM CHLORIDE 0.9 % IV SOLN: 500.0000 mL | Freq: Once | INTRAVENOUS | Status: DC

## 2023-07-19 NOTE — Progress Notes (Signed)
GASTROENTEROLOGY PROCEDURE H&P NOTE   Primary Care Physician: Sharmon Revere, MD    Reason for Procedure:   Hx of polyps  Plan:    colonoscopy  Patient is appropriate for endoscopic procedure(s) in the ambulatory ([facility]) setting.  The nature of the procedure, as well as the risks, benefits, and alternatives were carefully and thoroughly reviewed with the patient. Ample time for discussion and questions allowed. The patient understood, was satisfied, and agreed to proceed.     HPI: Cesar [name] is a 68 y.o. male who presents for colonoscopy.  Medical history as below.  Tolerated the prep.  No recent chest pain or shortness of breath.  No abdominal pain today.  Past Medical History:  Diagnosis Date   Arthritis    "shoulders" (04/23/2014)   Bursitis of shoulder    HYPERTENSION 01/15/2008   Hypertension    OSA on CPAP    SHOULDER, PAIN 01/15/2008   Sinus headache    "take OTC medications prn"   Sleep apnea    uses CPAP   Tendonitis of shoulder    "both"   TESTOSTERONE DEFICIENCY 05/21/2008   TIA (transient ischemic attack) 04/23/2014   "that's what they think I've had" (04/23/2014)    Past Surgical History:  Procedure Laterality Date   APPENDECTOMY  1978   COLONOSCOPY  2014   INGUINAL HERNIA REPAIR Left 1973   KNEE ARTHROSCOPY WITH PATELLAR TENDON REPAIR Left 12/21/2012   Procedure: LEFT KNEE ARTHROSCOPY WITH MEDIAL and LATERALMENISECTOMY AND QUARDRICEP TENDON REPAIR;  Surgeon: Loreta Ave, MD;  Location: [facility];  Service: Orthopedics;  Laterality: Left;   PLANTAR FASCIA SURGERY Left 7/10   heel   POLYPECTOMY     TONSILLECTOMY  1960's    Prior to Admission medications   Medication Sig Start Date End Date Taking? Authorizing Provider  hydrochlorothiazide (MICROZIDE) 12.5 MG capsule Take 12.5 mg by mouth daily. 05/28/23  Yes [provider]  losartan (COZAAR) 25 MG tablet Take 25 mg by mouth every morning. 07/13/23  Yes [provider]  testosterone cypionate (DEPOTESTOSTERONE CYPIONATE) 200 MG/ML injection Inject 0.5 mLs into the muscle once a week. 07/01/21  Yes [provider]  aspirin EC 81 MG EC tablet Take 1 tablet (81 mg total) by mouth daily. 04/24/14   [name], Werner Lean, MD  diphenhydrAMINE HCl (ALLERGY MED PO) Take by mouth. PRN    [provider]  meclizine (ANTIVERT) 25 MG tablet Take 25 mg by mouth 3 (three) times daily as needed. 07/13/23   [provider]    Current Outpatient Medications  Medication Sig Dispense Refill   hydrochlorothiazide (MICROZIDE) 12.5 MG capsule Take 12.5 mg by mouth daily.     losartan (COZAAR) 25 MG tablet Take 25 mg by mouth every morning.     testosterone cypionate (DEPOTESTOSTERONE CYPIONATE) 200 MG/ML injection Inject 0.5 mLs into the muscle once a week.     aspirin EC 81 MG EC tablet Take 1 tablet (81 mg total) by mouth daily.     diphenhydrAMINE HCl (ALLERGY MED PO) Take by mouth. PRN     meclizine (ANTIVERT) 25 MG tablet Take 25 mg by mouth 3 (three) times daily as needed.     Current Facility-Administered Medications  Medication Dose Route Frequency Provider Last Rate Last Admin   0.9 %  sodium chloride infusion  500 mL Intravenous Once Catalina Salasar, Carie Caddy, MD        Allergies as of 07/19/2023 - Review Complete  07/19/2023  Allergen Reaction Noted   Codeine Nausea And Vomiting 12/31/2014    Family History  Problem Relation Age of Onset   Hypertension Mother    Stroke Father    Hypertension Sister    Colon cancer Neg Hx    Stomach cancer Neg Hx    Esophageal cancer Neg Hx    Rectal cancer Neg Hx    Colon polyps Neg Hx     Social History   Socioeconomic History   Marital status: Married    Spouse name: Not on file   Number of children: 5   Years of education: Not on file   Highest education level: Not on file  Occupational History   Occupation: Runner, broadcasting/film/video  Tobacco Use   Smoking status: Never   Smokeless tobacco: Never   Vaping Use   Vaping status: Never Used  Substance and Sexual Activity   Alcohol use: No   Drug use: No   Sexual activity: Yes  Other Topics Concern   Not on file  Social History Narrative   Patient is right handed.   Patient drinks 1 cup caffeine daily   Social Drivers of Corporate investment banker Strain: Not on file  Food Insecurity: Not on file  Transportation Needs: Not on file  Physical Activity: Not on file  Stress: Not on file  Social Connections: Not on file  Intimate Partner Violence: Not on file    Physical Exam: Vital signs in last 24 hours: @BP  (!) 152/80   Pulse 63   Temp 98.5 F (36.9 C)   Ht 5\' 8"  (1.727 m)   Wt 190 lb (86.2 kg)   SpO2 98%   BMI 28.89 kg/m  GEN: NAD EYE: Sclerae anicteric ENT: MMM CV: Non-tachycardic Pulm: CTA b/l GI: Soft, NT/ND NEURO:  Alert & Oriented x 3   Erick Blinks, MD [facility]  07/19/2023 10:52 AM

## 2023-07-19 NOTE — Patient Instructions (Signed)

## 2023-07-19 NOTE — Progress Notes (Signed)
 Pt's states no medical or surgical changes since previsit or office visit.

## 2023-07-19 NOTE — Progress Notes (Signed)
 Called to room to assist during endoscopic procedure.  Patient ID and intended procedure confirmed with present staff. Received instructions for my participation in the procedure from the performing physician.

## 2023-07-19 NOTE — Op Note (Signed)
[facility] Patient Name: Cesar [name] Procedure Date: 07/19/2023 10:53 AM MRN: [id number] Endoscopist: Beverley Fiedler , MD, [id number] Age: 68 Referring MD:  Date of Birth: 1955/10/13 Gender: Male Account #: [id number] Procedure:                Colonoscopy Indications:              High risk colon cancer surveillance: Personal                            history of multiple adenomas, Last colonoscopy:                            November 2021 (TA x1), July 2016 (TA x 4) Medicines:                Monitored Anesthesia Care Procedure:                Pre-Anesthesia Assessment:                           - Prior to the procedure, a History and Physical                            was performed, and patient medications and                            allergies were reviewed. The patient's tolerance of                            previous anesthesia was also reviewed. The risks                            and benefits of the procedure and the sedation                            options and risks were discussed with the patient.                            All questions were answered, and informed consent                            was obtained. Prior Anticoagulants: The patient has                            taken no anticoagulant or antiplatelet agents. ASA                            Grade Assessment: II - A patient with mild systemic                            disease. After reviewing the risks and benefits,                            the patient was deemed in satisfactory condition to  undergo the procedure.                           After obtaining informed consent, the colonoscope                            was passed under direct vision. Throughout the                            procedure, the patient's blood pressure, pulse, and                            oxygen saturations were monitored continuously. The                            Olympus Scope SN [id number]  was introduced through the                            anus and advanced to the cecum, identified by                            appendiceal orifice and ileocecal valve. The                            colonoscopy was performed without difficulty. The                            patient tolerated the procedure well. The quality                            of the bowel preparation was good. The ileocecal                            valve, appendiceal orifice, and rectum were                            photographed. Scope In: 11:00:18 AM Scope Out: 11:11:41 AM Scope Withdrawal Time: 0 hours 10 minutes 6 seconds  Total Procedure Duration: 0 hours 11 minutes 23 seconds  Findings:                 The digital rectal exam was normal.                           A polypoid lesion was found at the appendiceal                            orifice. The lesion was polypoid. Biopsies were                            taken with a cold forceps for histology.                           Two sessile polyps were found in the transverse  colon. The polyps were 3 to 6 mm in size. These                            polyps were removed with a cold snare. Resection                            and retrieval were complete.                           Multiple medium-mouthed and small-mouthed                            diverticula were found in the sigmoid colon,                            descending colon and hepatic flexure.                           The retroflexed view of the distal rectum and anal                            verge was normal and showed no anal or rectal                            abnormalities. Complications:            No immediate complications. Estimated Blood Loss:     Estimated blood loss was minimal. Impression:               - Polypoid lesion at the appendiceal orifice.                            Biopsied. This was seen in 2019 and biopsies                             consistent with inverted appendiceal tissue.                           - Two 3 to 6 mm polyps in the transverse colon,                            removed with a cold snare. Resected and retrieved.                           - Mild diverticulosis in the sigmoid colon, in the                            descending colon and at the hepatic flexure.                           - The distal rectum and anal verge are normal on                            retroflexion view. Recommendation:           -  Patient has a contact number available for                            emergencies. The signs and symptoms of potential                            delayed complications were discussed with the                            patient. Return to normal activities tomorrow.                            Written discharge instructions were provided to the                            patient.                           - Resume previous diet.                           - Continue present medications.                           - Await pathology results.                           - Repeat colonoscopy is recommended for                            surveillance. The colonoscopy date will be                            determined after pathology results from today's                            exam become available for review. Beverley Fiedler, MD 07/19/2023 11:24:07 AM This report has been signed electronically.

## 2023-07-19 NOTE — Progress Notes (Signed)
 Report to PACU, RN, vss, BBS= Clear.

## 2023-07-20 ENCOUNTER — Telehealth: Payer: Self-pay

## 2023-07-20 NOTE — Telephone Encounter (Signed)
  Follow up Call-     07/19/2023   10:49 AM 07/19/2023   10:39 AM  Call back number  Post procedure Call Back phone  # [phone]  Permission to leave phone message Yes Yes     Patient questions:  Do you have a fever, pain , or abdominal swelling? No. Pain Score  0 *  Have you tolerated food without any problems? Yes.    Have you been able to return to your normal activities? Yes.    Do you have any questions about your discharge instructions: Diet   No. Medications  No. Follow up visit  No.  Do you have questions or concerns about your Care? No.  Actions: * If pain score is 4 or above: No action needed, pain <4.

## 2023-07-21 LAB — SURGICAL PATHOLOGY

## 2023-07-22 ENCOUNTER — Encounter: Payer: Self-pay | Admitting: Internal Medicine

## 2023-08-16 ENCOUNTER — Other Ambulatory Visit: Payer: Self-pay | Admitting: Family Medicine

## 2023-08-16 ENCOUNTER — Ambulatory Visit
Admission: RE | Admit: 2023-08-16 | Discharge: 2023-08-16 | Disposition: A | Discharge status: Home / Self Care | Source: Ambulatory Visit | Attending: Family Medicine | Admitting: Family Medicine

## 2023-08-16 DIAGNOSIS — M545 Low back pain, unspecified: Secondary | ICD-10-CM
